# Patient Record
Sex: Female | Born: 1935 | Race: White | Hispanic: No | Marital: Married | Smoking: Never smoker
Health system: Southern US, Community
[De-identification: ages and names within clinical notes are randomized; demographics above are authoritative.]

## PROBLEM LIST (undated history)

## (undated) DIAGNOSIS — J342 Deviated nasal septum: Secondary | ICD-10-CM

## (undated) DIAGNOSIS — M1990 Unspecified osteoarthritis, unspecified site: Secondary | ICD-10-CM

## (undated) DIAGNOSIS — L439 Lichen planus, unspecified: Secondary | ICD-10-CM

## (undated) DIAGNOSIS — H401190 Primary open-angle glaucoma, unspecified eye, stage unspecified: Secondary | ICD-10-CM

## (undated) HISTORY — PX: DILATION AND CURETTAGE OF UTERUS: SHX78

## (undated) HISTORY — PX: TUMOR EXCISION: SHX421

## (undated) HISTORY — PX: NECK SURGERY: SHX720

---

## 2013-12-26 DIAGNOSIS — L719 Rosacea, unspecified: Secondary | ICD-10-CM | POA: Insufficient documentation

## 2013-12-26 DIAGNOSIS — N3281 Overactive bladder: Secondary | ICD-10-CM | POA: Insufficient documentation

## 2013-12-26 DIAGNOSIS — R7302 Impaired glucose tolerance (oral): Secondary | ICD-10-CM | POA: Insufficient documentation

## 2013-12-26 DIAGNOSIS — M8580 Other specified disorders of bone density and structure, unspecified site: Secondary | ICD-10-CM | POA: Insufficient documentation

## 2013-12-26 DIAGNOSIS — M810 Age-related osteoporosis without current pathological fracture: Secondary | ICD-10-CM | POA: Insufficient documentation

## 2013-12-26 DIAGNOSIS — M5136 Other intervertebral disc degeneration, lumbar region: Secondary | ICD-10-CM | POA: Insufficient documentation

## 2013-12-26 DIAGNOSIS — M48061 Spinal stenosis, lumbar region without neurogenic claudication: Secondary | ICD-10-CM | POA: Insufficient documentation

## 2013-12-26 DIAGNOSIS — R7303 Prediabetes: Secondary | ICD-10-CM | POA: Insufficient documentation

## 2013-12-26 DIAGNOSIS — E7800 Pure hypercholesterolemia, unspecified: Secondary | ICD-10-CM | POA: Insufficient documentation

## 2013-12-26 DIAGNOSIS — H409 Unspecified glaucoma: Secondary | ICD-10-CM | POA: Insufficient documentation

## 2013-12-26 DIAGNOSIS — G629 Polyneuropathy, unspecified: Secondary | ICD-10-CM | POA: Insufficient documentation

## 2014-02-04 DIAGNOSIS — M5416 Radiculopathy, lumbar region: Secondary | ICD-10-CM | POA: Insufficient documentation

## 2014-02-04 DIAGNOSIS — H903 Sensorineural hearing loss, bilateral: Secondary | ICD-10-CM | POA: Insufficient documentation

## 2014-02-04 DIAGNOSIS — M25552 Pain in left hip: Secondary | ICD-10-CM | POA: Insufficient documentation

## 2014-02-07 DIAGNOSIS — M461 Sacroiliitis, not elsewhere classified: Secondary | ICD-10-CM | POA: Insufficient documentation

## 2014-02-11 ENCOUNTER — Ambulatory Visit: Payer: Self-pay | Admitting: Physical Medicine and Rehabilitation

## 2014-03-27 DIAGNOSIS — M170 Bilateral primary osteoarthritis of knee: Secondary | ICD-10-CM | POA: Insufficient documentation

## 2014-04-29 DIAGNOSIS — M7062 Trochanteric bursitis, left hip: Secondary | ICD-10-CM | POA: Insufficient documentation

## 2014-07-27 ENCOUNTER — Other Ambulatory Visit: Payer: Self-pay | Admitting: Internal Medicine

## 2014-07-27 DIAGNOSIS — Z1231 Encounter for screening mammogram for malignant neoplasm of breast: Secondary | ICD-10-CM

## 2014-07-27 DIAGNOSIS — L219 Seborrheic dermatitis, unspecified: Secondary | ICD-10-CM | POA: Insufficient documentation

## 2014-08-20 ENCOUNTER — Ambulatory Visit
Admission: RE | Admit: 2014-08-20 | Discharge: 2014-08-20 | Disposition: A | Discharge status: Home / Self Care | Payer: Medicare PPO | Source: Ambulatory Visit | Attending: Internal Medicine | Admitting: Internal Medicine

## 2014-08-20 DIAGNOSIS — Z1231 Encounter for screening mammogram for malignant neoplasm of breast: Secondary | ICD-10-CM | POA: Diagnosis present

## 2014-10-21 DIAGNOSIS — M21371 Foot drop, right foot: Secondary | ICD-10-CM | POA: Insufficient documentation

## 2014-10-21 DIAGNOSIS — M21372 Foot drop, left foot: Secondary | ICD-10-CM | POA: Insufficient documentation

## 2015-01-27 DIAGNOSIS — N182 Chronic kidney disease, stage 2 (mild): Secondary | ICD-10-CM | POA: Insufficient documentation

## 2015-03-11 DIAGNOSIS — G5691 Unspecified mononeuropathy of right upper limb: Secondary | ICD-10-CM | POA: Insufficient documentation

## 2015-03-31 DIAGNOSIS — G5603 Carpal tunnel syndrome, bilateral upper limbs: Secondary | ICD-10-CM | POA: Insufficient documentation

## 2015-03-31 DIAGNOSIS — G5600 Carpal tunnel syndrome, unspecified upper limb: Secondary | ICD-10-CM | POA: Insufficient documentation

## 2015-04-22 DIAGNOSIS — R262 Difficulty in walking, not elsewhere classified: Secondary | ICD-10-CM | POA: Insufficient documentation

## 2015-05-26 ENCOUNTER — Other Ambulatory Visit: Payer: Self-pay | Admitting: Surgery

## 2015-05-26 DIAGNOSIS — M21372 Foot drop, left foot: Secondary | ICD-10-CM

## 2015-05-26 DIAGNOSIS — M21371 Foot drop, right foot: Secondary | ICD-10-CM

## 2015-05-26 DIAGNOSIS — M5136 Other intervertebral disc degeneration, lumbar region: Secondary | ICD-10-CM

## 2015-06-16 ENCOUNTER — Ambulatory Visit
Admission: RE | Admit: 2015-06-16 | Discharge: 2015-06-16 | Disposition: A | Discharge status: Home / Self Care | Payer: Medicare Other | Source: Ambulatory Visit | Attending: Surgery | Admitting: Surgery

## 2015-06-16 DIAGNOSIS — Q631 Lobulated, fused and horseshoe kidney: Secondary | ICD-10-CM | POA: Insufficient documentation

## 2015-06-16 DIAGNOSIS — K8020 Calculus of gallbladder without cholecystitis without obstruction: Secondary | ICD-10-CM | POA: Diagnosis not present

## 2015-06-16 DIAGNOSIS — M21371 Foot drop, right foot: Secondary | ICD-10-CM | POA: Diagnosis present

## 2015-06-16 DIAGNOSIS — M5136 Other intervertebral disc degeneration, lumbar region: Secondary | ICD-10-CM | POA: Insufficient documentation

## 2015-06-16 DIAGNOSIS — M21372 Foot drop, left foot: Secondary | ICD-10-CM | POA: Insufficient documentation

## 2015-06-16 DIAGNOSIS — M4856XA Collapsed vertebra, not elsewhere classified, lumbar region, initial encounter for fracture: Secondary | ICD-10-CM | POA: Insufficient documentation

## 2015-07-28 ENCOUNTER — Other Ambulatory Visit: Payer: Self-pay | Admitting: Internal Medicine

## 2015-07-28 DIAGNOSIS — K8689 Other specified diseases of pancreas: Secondary | ICD-10-CM

## 2015-07-28 DIAGNOSIS — Z1231 Encounter for screening mammogram for malignant neoplasm of breast: Secondary | ICD-10-CM

## 2015-07-28 DIAGNOSIS — K8020 Calculus of gallbladder without cholecystitis without obstruction: Secondary | ICD-10-CM | POA: Insufficient documentation

## 2015-08-13 ENCOUNTER — Ambulatory Visit
Admission: RE | Admit: 2015-08-13 | Discharge: 2015-08-13 | Disposition: A | Discharge status: Home / Self Care | Payer: Medicare Other | Source: Ambulatory Visit | Attending: Internal Medicine | Admitting: Internal Medicine

## 2015-08-13 DIAGNOSIS — K8020 Calculus of gallbladder without cholecystitis without obstruction: Secondary | ICD-10-CM | POA: Diagnosis not present

## 2015-08-13 DIAGNOSIS — K8689 Other specified diseases of pancreas: Secondary | ICD-10-CM | POA: Insufficient documentation

## 2015-08-13 DIAGNOSIS — Q631 Lobulated, fused and horseshoe kidney: Secondary | ICD-10-CM | POA: Insufficient documentation

## 2015-08-13 MED ORDER — GADOBENATE DIMEGLUMINE 529 MG/ML IV SOLN
15.0000 mL | Freq: Once | INTRAVENOUS | Status: AC | PRN
  Administered 2015-08-13: 15 mL via INTRAVENOUS

## 2015-08-23 ENCOUNTER — Ambulatory Visit
Admission: RE | Admit: 2015-08-23 | Discharge: 2015-08-23 | Disposition: A | Discharge status: Home / Self Care | Payer: Medicare Other | Source: Ambulatory Visit | Attending: Internal Medicine | Admitting: Internal Medicine

## 2015-08-23 DIAGNOSIS — Z1231 Encounter for screening mammogram for malignant neoplasm of breast: Secondary | ICD-10-CM | POA: Diagnosis not present

## 2015-09-07 ENCOUNTER — Other Ambulatory Visit: Payer: Self-pay

## 2015-09-08 ENCOUNTER — Ambulatory Visit (INDEPENDENT_AMBULATORY_CARE_PROVIDER_SITE_OTHER): Payer: Medicare Other | Admitting: Gastroenterology

## 2015-09-08 ENCOUNTER — Encounter: Payer: Self-pay | Admitting: Gastroenterology

## 2015-09-08 VITALS — BP 149/71 | HR 70 | Temp 98.6°F | Ht 67.0 in | Wt 164.0 lb

## 2015-09-08 DIAGNOSIS — K862 Cyst of pancreas: Secondary | ICD-10-CM

## 2015-09-08 NOTE — Progress Notes (Signed)
[facility] Consultation  Referring Provider:     Thies, David, MD Primary Care Physician:  Mickey Farber, MD Primary Gastroenterologist:  Dr. Servando Snare     Reason for Consultation:     Pancreatic cyst        HPI:   Alice Bond is a 82 y.o. y/o female referred for consultation & management of Pancreatic cyst by Dr. Harrington Challenger, DAVID, MD.  This patient was sent to me for evaluation of the pancreatic lesion. The patient had been having an MRI for her spine and was found to have an incidental finding of a pancreatic Lesion. The patient then underwent an MRI of the pancreas that showed a 3.1 on 1.1 cm lesion in the pancreatic tail consistent with a side branch IPMN. The patient has no symptoms of this lesion. She states that she has been in her normal state of health without any rectal bleeding diarrhea constipation or unexplained weight loss. The patient also reports that she has not had any abdominal pain.  Past Medical History  Diagnosis Date  . Diabetes mellitus without complication [facility])     Past Surgical History  Procedure Laterality Date  . Dilation and curettage of uterus    . Neck surgery      Prior to Admission medications   Medication Sig Start Date End Date Taking? Authorizing Provider  acetaminophen (TYLENOL) 500 MG tablet Take by mouth.   Yes Historical Provider, MD  Calcium Citrate-Vitamin D (CALCIUM CITRATE + D) 315-250 MG-UNIT TABS Take by mouth.   Yes Historical Provider, MD  ketoconazole (NIZORAL) 2 % cream Apply topically. 07/28/15  Yes Historical Provider, MD  latanoprost (XALATAN) 0.005 % ophthalmic solution Apply to eye. 07/17/14  Yes Historical Provider, MD  metFORMIN (GLUCOPHAGE-XR) 500 MG 24 hr tablet Take by mouth. 07/28/15  Yes Historical Provider, MD  metroNIDAZOLE (METROCREAM) 0.75 % cream Apply topically. 07/28/15  Yes Historical Provider, MD  Multiple Vitamin (MULTI-VITAMINS) TABS Take by mouth.   Yes Historical Provider, MD  tolterodine (DETROL) 2 MG tablet  Take by mouth. 08/11/15 08/10/16 Yes Historical Provider, MD  triamcinolone (KENALOG) 0.1 % paste Apply topically. 11/04/13  Yes Historical Provider, MD  trolamine salicylate (ASPERCREME) 10 % cream Apply topically.   Yes Historical Provider, MD    Family History  Problem Relation Age of Onset  . Breast cancer Mother 57  . Cancer Mother   . Breast cancer Maternal Aunt 71     Social History  Substance Use Topics  . Smoking status: Never Smoker   . Smokeless tobacco: Never Used  . Alcohol Use: Yes    Allergies as of 09/08/2015  . (No Known Allergies)    Review of Systems:    All systems reviewed and negative except where noted in HPI.   Physical Exam:  BP 149/71 mmHg  Pulse 70  Temp(Src) 98.6 F (37 C) (Oral)  Ht  (1.702 m)  Wt 164 lb (74.39 kg)  BMI 25.68 kg/m2 No LMP recorded. Patient is postmenopausal. Psych:  Alert and cooperative. Normal mood and affect. General:   Alert,  Well-developed, well-nourished, pleasant and cooperative in NAD Head:  Normocephalic and atraumatic. Eyes:  Sclera clear, no icterus.   Conjunctiva pink. Ears:  Normal auditory acuity. Nose:  No deformity, discharge, or lesions. Mouth:  No deformity or lesions,oropharynx pink & moist. Neck:  Supple; no masses or thyromegaly. Lungs:  Respirations even and unlabored.  Clear throughout to auscultation.   No wheezes, crackles, or rhonchi. No acute  distress. Heart:  Regular rate and rhythm; no murmurs, clicks, rubs, or gallops. Abdomen:  Normal bowel sounds.  No bruits.  Soft, non-tender and non-distended without masses, hepatosplenomegaly or hernias noted.  No guarding or rebound tenderness.  Negative Carnett sign.   Rectal:  Deferred.  Msk:  Symmetrical without gross deformities.  Good, equal movement & strength bilaterally. Pulses:  Normal pulses noted. Extremities:  No clubbing or edema.  No cyanosis. Neurologic:  Alert and oriented x3;  grossly normal neurologically. Skin:  Intact without  significant lesions or rashes.  No jaundice. Lymph Nodes:  No significant cervical adenopathy. Psych:  Alert and cooperative. Normal mood and affect.  Imaging Studies: Mr Abdomen W Wo Contrast  08/13/2015  CLINICAL DATA:  Abnormal finding on recent lumbar spine MRI. EXAM: MRI ABDOMEN WITHOUT AND WITH CONTRAST TECHNIQUE: Multiplanar multisequence MR imaging of the abdomen was performed both before and after the administration of intravenous contrast. CONTRAST:  15mL MULTIHANCE GADOBENATE DIMEGLUMINE 529 MG/ML IV SOLN COMPARISON:  06/16/2015 FINDINGS: Lower chest:  There is no pleural fluid identified. Hepatobiliary: Left lobe of liver cyst measures 8 mm. No suspicious enhancing liver abnormalities identified. There are multiple stones identified within the gallbladder. These measure up to 1.2 cm. There is no biliary dilatation. Pancreas: Multi locular cystic lesion within the distal tail of pancreas measures 3.2 x 1.1, image 16 of series 2. Spleen:  Normal appearance of the spleen. Adrenals/Urinary Tract: The adrenal glands are normal. 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 Bilateral pelvocaliectasis is identified. No discrete enhancing mass identified. Stomach/Bowel: Normal appearance of the stomach. The upper abdominal bowel loops have a normal caliber. Vascular/Lymphatic: Normal appearance of the abdominal aorta. No enlarged upper abdominal lymph nodes identified. Other:  There is no free fluid identified. Musculoskeletal: Scoliosis and degenerative disc disease is identified. IMPRESSION: 1. There is a cystic lesion within the tail of pancreas. This may represent a benign or potentially malignant cystic neoplasm. The appearance is favored to represent side branch differential considerations include serous cystadenoma or side branch IPMN. Management strategies would include a followup examination in 6 months versus endoscopic ultrasound and potential biopsy if indicated. 2. Gallstones.  No biliary dilatation. 3.  Horseshoe kidney with bilateral pelvocaliectasis left greater than right. Electronically Signed   By: Signa Kell M.D.   On: 08/13/2015 10:06   Mm Digital Screening Bilateral  08/23/2015  CLINICAL DATA:  Screening. EXAM: DIGITAL SCREENING BILATERAL MAMMOGRAM WITH CAD COMPARISON:  Previous exam(s). ACR Breast Density Category c: The breast tissue is heterogeneously dense, which may obscure small masses. FINDINGS: There are no findings suspicious for malignancy. Images were processed with CAD. IMPRESSION: No mammographic evidence of malignancy. A result letter of this screening mammogram will be mailed directly to the patient. RECOMMENDATION: Screening mammogram in one year. (Code:[id number]-01Y) BI-RADS CATEGORY  1: Negative. Electronically Signed   By: Rolla Plate M.D.   On: 08/23/2015 10:21    Assessment and Plan:   Alice Bond is a 82 y.o. y/o female comes in with an incidental finding of a cystic lesion in the pancreatic tail that is most consistent with a side branch IPMN. These are lesions that have neoplastic potential but the side branch lesions are less aggressive in the main branch lesions. The size of the lesion is 3.1 cm in its largest dimension which is concerning. The patient has been given an option of a endoscopic ultrasound versus repeating the MRI in 4 months to see if there is any increase in size. The patient  states that she has a minimalist view of this and does not want to be overly aggressive. Although the patient is in good health her advanced age of 82 ports are at increased risk for major pancreatic surgery. If the lesion is deemed to be undergoing malignant transformation or increasing in size the patient has agreed to further studies such as an EUS. The patient has been explained the plan and agrees with it.   Note: This dictation was prepared with Dragon dictation along with smaller phrase technology. Any transcriptional errors that result from this process are  unintentional.

## 2015-09-09 ENCOUNTER — Ambulatory Visit: Payer: Self-pay | Admitting: Gastroenterology

## 2015-11-02 ENCOUNTER — Other Ambulatory Visit: Payer: Self-pay

## 2015-11-02 ENCOUNTER — Telehealth: Payer: Self-pay | Admitting: Gastroenterology

## 2015-11-02 DIAGNOSIS — K869 Disease of pancreas, unspecified: Secondary | ICD-10-CM

## 2015-11-02 NOTE — Telephone Encounter (Signed)
Pt scheduled for a repeat MRI at [facility] on Sept 6th @ 9:00am. Pt has been given instructions and location.

## 2015-11-02 NOTE — Telephone Encounter (Signed)
Patient called and stated that she needs a repeat abdominal MRI the first of September. Please call

## 2015-12-15 ENCOUNTER — Ambulatory Visit
Admission: RE | Admit: 2015-12-15 | Discharge: 2015-12-15 | Disposition: A | Discharge status: Home / Self Care | Payer: Medicare Other | Source: Ambulatory Visit | Attending: Gastroenterology | Admitting: Gastroenterology

## 2015-12-15 ENCOUNTER — Other Ambulatory Visit: Payer: Self-pay

## 2015-12-15 DIAGNOSIS — K8020 Calculus of gallbladder without cholecystitis without obstruction: Secondary | ICD-10-CM | POA: Diagnosis not present

## 2015-12-15 DIAGNOSIS — K869 Disease of pancreas, unspecified: Secondary | ICD-10-CM

## 2015-12-15 DIAGNOSIS — K838 Other specified diseases of biliary tract: Secondary | ICD-10-CM | POA: Diagnosis present

## 2015-12-15 DIAGNOSIS — I700 Atherosclerosis of aorta: Secondary | ICD-10-CM | POA: Diagnosis not present

## 2015-12-15 DIAGNOSIS — K862 Cyst of pancreas: Secondary | ICD-10-CM | POA: Insufficient documentation

## 2015-12-15 DIAGNOSIS — N1330 Unspecified hydronephrosis: Secondary | ICD-10-CM | POA: Insufficient documentation

## 2015-12-15 DIAGNOSIS — Q631 Lobulated, fused and horseshoe kidney: Secondary | ICD-10-CM | POA: Diagnosis not present

## 2015-12-15 LAB — POCT I-STAT CREATININE: Creatinine, Ser: 0.8 mg/dL (ref 0.44–1.00)

## 2015-12-15 MED ORDER — GADOBENATE DIMEGLUMINE 529 MG/ML IV SOLN
15.0000 mL | Freq: Once | INTRAVENOUS | Status: AC | PRN
  Administered 2015-12-15: 14 mL via INTRAVENOUS

## 2015-12-17 ENCOUNTER — Other Ambulatory Visit: Payer: Self-pay

## 2015-12-20 ENCOUNTER — Ambulatory Visit (INDEPENDENT_AMBULATORY_CARE_PROVIDER_SITE_OTHER): Payer: Medicare Other | Admitting: Gastroenterology

## 2015-12-20 ENCOUNTER — Encounter: Payer: Self-pay | Admitting: Gastroenterology

## 2015-12-20 ENCOUNTER — Other Ambulatory Visit: Payer: Self-pay

## 2015-12-20 ENCOUNTER — Telehealth: Payer: Self-pay

## 2015-12-20 VITALS — BP 139/71 | HR 93 | Temp 98.3°F | Ht 67.0 in | Wt 168.0 lb

## 2015-12-20 DIAGNOSIS — K862 Cyst of pancreas: Secondary | ICD-10-CM | POA: Diagnosis not present

## 2015-12-20 NOTE — Progress Notes (Signed)
Primary Care Physician: Mickey FarberHIES, DAVID, MD  Primary Gastroenterologist:  Dr. Midge Miniumarren Laquida Cotrell  Chief Complaint  Patient presents with  . Follow up MRI results    HPI: Alice Bond is a 82 y.o. female here for follow-up after having an MRI of the pancreatic cyst that was found incidentally in the past. The patient has had no symptoms and reports feeling well. MRI of the pancreatic lesion showed the lesion to be stable and cystic. It was recommended that a repeat MRI be done with and without contrast in 2 years and that the features of the cysts were benign-appearing  Current Outpatient Prescriptions  Medication Sig Dispense Refill  . acetaminophen (TYLENOL) 500 MG tablet Take by mouth.    . Calcium Citrate-Vitamin D (CALCIUM CITRATE + D) 315-250 MG-UNIT TABS Take by mouth.    Marland Kitchen. ketoconazole (NIZORAL) 2 % cream Apply topically.    . latanoprost (XALATAN) 0.005 % ophthalmic solution Apply to eye.    . metFORMIN (GLUCOPHAGE-XR) 500 MG 24 hr tablet Take by mouth.    . metroNIDAZOLE (METROCREAM) 0.75 % cream Apply topically.    . Multiple Vitamin (MULTI-VITAMINS) TABS Take by mouth.    . tolterodine (DETROL) 1 MG tablet     . triamcinolone (KENALOG) 0.1 % paste Apply topically.    . trolamine salicylate (ASPERCREME) 10 % cream Apply topically.    . tolterodine (DETROL) 2 MG tablet Take by mouth.     No current facility-administered medications for this visit.     Allergies as of 12/20/2015  . (No Known Allergies)    ROS:  General: Negative for anorexia, weight loss, fever, chills, fatigue, weakness. ENT: Negative for hoarseness, difficulty swallowing , nasal congestion. CV: Negative for chest pain, angina, palpitations, dyspnea on exertion, peripheral edema.  Respiratory: Negative for dyspnea at rest, dyspnea on exertion, cough, sputum, wheezing.  GI: See history of present illness. GU:  Negative for dysuria, hematuria, urinary incontinence, urinary frequency, nocturnal urination.    Endo: Negative for unusual weight change.    Physical Examination:   BP 139/71   Pulse 93   Temp 98.3 F (36.8 C) (Oral)   Ht 5\' 7"  (1.702 m)   Wt 168 lb (76.2 kg)   BMI 26.31 kg/m   General: Well-nourished, well-developed in no acute distress.  Eyes: No icterus. Conjunctivae pink. Skin: Warm and dry, no jaundice.   Psych: Alert and cooperative, normal mood and affect.  Labs:    Imaging Studies: Mr Abdomen W Wo Contrast  Result Date: 12/15/2015 CLINICAL DATA:  Follow-up pancreatic tail cystic mass. EXAM: MRI ABDOMEN WITHOUT AND WITH CONTRAST TECHNIQUE: Multiplanar multisequence MR imaging of the abdomen was performed both before and after the administration of intravenous contrast. CONTRAST:  14mL MULTIHANCE GADOBENATE DIMEGLUMINE 529 MG/ML IV SOLN COMPARISON:  08/13/2015 MRI abdomen. FINDINGS: Lower chest: No significant pulmonary nodules or acute consolidative airspace disease. Hepatobiliary: Normal liver size. Stable 0.9 cm simple left liver lobe cyst. No additional liver lesions. No hepatic steatosis. Nondistended gallbladder is filled with several gallstones measuring up to 1.7 cm, with no gallbladder wall thickening or pericholecystic fluid . No biliary ductal dilatation. Common bile duct diameter 4 mm. No choledocholithiasis. Small periampullary duodenal diverticulum, unchanged. Pancreas: No evidence of pancreas divisum. There is a lobular 3.2 x 1.1 x 1.1 cm pancreatic tail cystic mass (series 7/image 21) with a few thin eccentric septations and no solid nodular enhancement, previously 3.2 x 1.1 x 1.0 cm, unchanged. No additional pancreatic lesions. Normal caliber  main pancreatic duct (2 mm diameter). Spleen: Normal size. No mass. Adrenals/Urinary Tract: Normal adrenals. Horseshoe kidney. No renal mass. No right hydronephrosis. Stable mild left hydronephrosis. Stomach/Bowel: Grossly normal stomach. Visualized small and large bowel is normal caliber, with no bowel wall thickening.  Vascular/Lymphatic: Atherosclerotic nonaneurysmal abdominal aorta. Patent portal, splenic, hepatic and renal veins. No pathologically enlarged lymph nodes in the abdomen. Other: No abdominal ascites or focal fluid collection. Musculoskeletal: No aggressive appearing focal osseous lesions. IMPRESSION: 1. Stable multilocular 3.2 cm pancreatic tail cystic mass without aggressive features. No main pancreatic duct dilation. Follow-up pancreas protocol MRI abdomen without and with IV contrast is recommended in 2 years. This recommendation follows ACR consensus guidelines: Management of Incidental Pancreatic Cysts: A White Paper of the ACR Incidental Findings Committee. J Am Coll Radiol 2017;14:911-923. 2. Cholelithiasis. No biliary ductal dilatation. No choledocholithiasis. 3. Horseshoe kidney with stable mild left hydronephrosis. 4. Aortic atherosclerosis. Electronically Signed   By: Delbert Phenix M.D.   On: 12/15/2015 11:15    Assessment and Plan:   Alice Bond is a 82 y.o. y/o female for follow-up after having a MRI of a pancreas cyst. The MRI showed the cyst to be benign appearing and a repeat MRI was recommended in 2 years as per [employer] of radiology protocols. The patient has been explained this and agrees with the plan.   Note: This dictation was prepared with Dragon dictation along with smaller phrase technology. Any transcriptional errors that result from this process are unintentional.

## 2015-12-20 NOTE — Progress Notes (Signed)
Let the patient know the MRi is stable and did not change.

## 2015-12-20 NOTE — Telephone Encounter (Signed)
Pt had an appt today (12/20/15) to discuss results of MRI.

## 2015-12-20 NOTE — Telephone Encounter (Signed)
-----   Message from Midge Miniumarren Wohl, MD sent at 12/20/2015 11:18 AM EDT ----- Let the patient know the MRi is stable and did not change.

## 2016-02-24 ENCOUNTER — Encounter
Admission: EM | Disposition: A | Discharge status: Home Health | Payer: Self-pay | Source: Home / Self Care | Attending: Surgery

## 2016-02-24 ENCOUNTER — Emergency Department: Payer: Medicare Other | Admitting: Certified Registered Nurse Anesthetist

## 2016-02-24 ENCOUNTER — Emergency Department: Payer: Medicare Other

## 2016-02-24 ENCOUNTER — Inpatient Hospital Stay
Admission: EM | Admit: 2016-02-24 | Discharge: 2016-03-05 | DRG: 328 | Disposition: A | Discharge status: Home Health | Payer: Medicare Other | Attending: Surgery | Admitting: Surgery

## 2016-02-24 ENCOUNTER — Encounter: Payer: Self-pay | Admitting: Emergency Medicine

## 2016-02-24 DIAGNOSIS — R112 Nausea with vomiting, unspecified: Secondary | ICD-10-CM

## 2016-02-24 DIAGNOSIS — K275 Chronic or unspecified peptic ulcer, site unspecified, with perforation: Secondary | ICD-10-CM | POA: Diagnosis not present

## 2016-02-24 DIAGNOSIS — R1110 Vomiting, unspecified: Secondary | ICD-10-CM

## 2016-02-24 DIAGNOSIS — R109 Unspecified abdominal pain: Secondary | ICD-10-CM | POA: Diagnosis present

## 2016-02-24 DIAGNOSIS — Z801 Family history of malignant neoplasm of trachea, bronchus and lung: Secondary | ICD-10-CM

## 2016-02-24 DIAGNOSIS — K631 Perforation of intestine (nontraumatic): Secondary | ICD-10-CM | POA: Diagnosis not present

## 2016-02-24 DIAGNOSIS — K261 Acute duodenal ulcer with perforation: Principal | ICD-10-CM | POA: Diagnosis present

## 2016-02-24 DIAGNOSIS — N182 Chronic kidney disease, stage 2 (mild): Secondary | ICD-10-CM | POA: Diagnosis present

## 2016-02-24 DIAGNOSIS — E7800 Pure hypercholesterolemia, unspecified: Secondary | ICD-10-CM | POA: Diagnosis present

## 2016-02-24 DIAGNOSIS — R1084 Generalized abdominal pain: Secondary | ICD-10-CM

## 2016-02-24 DIAGNOSIS — Z803 Family history of malignant neoplasm of breast: Secondary | ICD-10-CM

## 2016-02-24 DIAGNOSIS — M6281 Muscle weakness (generalized): Secondary | ICD-10-CM

## 2016-02-24 DIAGNOSIS — R262 Difficulty in walking, not elsewhere classified: Secondary | ICD-10-CM

## 2016-02-24 HISTORY — PX: LAPAROTOMY: SHX154

## 2016-02-24 LAB — LIPASE, BLOOD: LIPASE: 32 U/L (ref 11–51)

## 2016-02-24 LAB — DIFFERENTIAL
BASOS PCT: 0 %
Basophils Absolute: 0 10*3/uL (ref 0–0.1)
EOS ABS: 0 10*3/uL (ref 0–0.7)
EOS PCT: 0 %
LYMPHS ABS: 1.4 10*3/uL (ref 1.0–3.6)
Lymphocytes Relative: 12 %
MONOS PCT: 8 %
Monocytes Absolute: 1 10*3/uL — ABNORMAL HIGH (ref 0.2–0.9)
Neutro Abs: 9.3 10*3/uL — ABNORMAL HIGH (ref 1.4–6.5)
Neutrophils Relative %: 80 %

## 2016-02-24 LAB — COMPREHENSIVE METABOLIC PANEL
ALT: 31 U/L (ref 14–54)
AST: 45 U/L — ABNORMAL HIGH (ref 15–41)
Albumin: 3.2 g/dL — ABNORMAL LOW (ref 3.5–5.0)
Alkaline Phosphatase: 52 U/L (ref 38–126)
Anion gap: 9 (ref 5–15)
BILIRUBIN TOTAL: 0.8 mg/dL (ref 0.3–1.2)
BUN: 21 mg/dL — ABNORMAL HIGH (ref 6–20)
CHLORIDE: 103 mmol/L (ref 101–111)
CO2: 25 mmol/L (ref 22–32)
CREATININE: 0.79 mg/dL (ref 0.44–1.00)
Calcium: 8.5 mg/dL — ABNORMAL LOW (ref 8.9–10.3)
Glucose, Bld: 128 mg/dL — ABNORMAL HIGH (ref 65–99)
POTASSIUM: 3.3 mmol/L — AB (ref 3.5–5.1)
Sodium: 137 mmol/L (ref 135–145)
TOTAL PROTEIN: 6.2 g/dL — AB (ref 6.5–8.1)

## 2016-02-24 LAB — CBC
HEMATOCRIT: 39.6 % (ref 35.0–47.0)
HEMOGLOBIN: 12.7 g/dL (ref 12.0–16.0)
MCH: 28.8 pg (ref 26.0–34.0)
MCHC: 32.1 g/dL (ref 32.0–36.0)
MCV: 89.8 fL (ref 80.0–100.0)
Platelets: 143 10*3/uL — ABNORMAL LOW (ref 150–440)
RBC: 4.41 MIL/uL (ref 3.80–5.20)
RDW: 14.5 % (ref 11.5–14.5)
WBC: 11.7 10*3/uL — ABNORMAL HIGH (ref 3.6–11.0)

## 2016-02-24 SURGERY — LAPAROTOMY, EXPLORATORY
Anesthesia: General | Wound class: Contaminated

## 2016-02-24 MED ORDER — HYDROMORPHONE HCL 1 MG/ML IJ SOLN: 1.0000 mg | Freq: Once | INTRAMUSCULAR | Status: DC

## 2016-02-24 MED ORDER — ACETAMINOPHEN 10 MG/ML IV SOLN
INTRAVENOUS | Status: DC | PRN
  Administered 2016-02-24: 1000 mg via INTRAVENOUS

## 2016-02-24 MED ORDER — IOPAMIDOL (ISOVUE-300) INJECTION 61%
100.0000 mL | Freq: Once | INTRAVENOUS | Status: AC | PRN
  Administered 2016-02-24: 100 mL via INTRAVENOUS

## 2016-02-24 MED ORDER — BUPIVACAINE LIPOSOME 1.3 % IJ SUSP
INTRAMUSCULAR | Status: DC | PRN
  Administered 2016-02-24: 20 mL

## 2016-02-24 MED ORDER — ONDANSETRON HCL 4 MG/2ML IJ SOLN
4.0000 mg | Freq: Once | INTRAMUSCULAR | Status: AC
Start: 2016-02-24 — End: 2016-02-24
  Administered 2016-02-24: 4 mg via INTRAVENOUS
  Filled 2016-02-24: qty 2

## 2016-02-24 MED ORDER — FENTANYL CITRATE (PF) 100 MCG/2ML IJ SOLN
INTRAMUSCULAR | Status: DC | PRN
  Administered 2016-02-24: 50 ug via INTRAVENOUS
  Administered 2016-02-24: 100 ug via INTRAVENOUS
  Administered 2016-02-24: 50 ug via INTRAVENOUS

## 2016-02-24 MED ORDER — ETOMIDATE 2 MG/ML IV SOLN
INTRAVENOUS | Status: DC | PRN
  Administered 2016-02-24: 20 mg via INTRAVENOUS

## 2016-02-24 MED ORDER — MORPHINE SULFATE 2 MG/ML IV SOLN
INTRAVENOUS | Status: DC
  Administered 2016-02-25: 2 mg via INTRAVENOUS
  Administered 2016-02-25 (×2): 1 mg via INTRAVENOUS
  Administered 2016-02-25: 02:00:00 via INTRAVENOUS
  Administered 2016-02-25: 2 mL via INTRAVENOUS
  Administered 2016-02-25: 0 mg via INTRAVENOUS
  Administered 2016-02-26: 2 mg via INTRAVENOUS
  Administered 2016-02-26: 4 mg via INTRAVENOUS
  Administered 2016-02-26 (×2): 2 mg via INTRAVENOUS
  Administered 2016-02-26: 4 mg via INTRAVENOUS
  Administered 2016-02-27 (×2): 1 mg via INTRAVENOUS
  Administered 2016-02-27: 2 mg via INTRAVENOUS
  Administered 2016-02-27: 1 mg via INTRAVENOUS
  Administered 2016-02-27 (×2): 3 mg via INTRAVENOUS
  Administered 2016-02-27: 4 mg via INTRAVENOUS
  Administered 2016-02-28: 1 mg via INTRAVENOUS
  Administered 2016-02-28: 2 mg via INTRAVENOUS
  Administered 2016-02-29: 6 mg via INTRAVENOUS
  Administered 2016-02-29: 7 mg via INTRAVENOUS
  Administered 2016-02-29: 5 mg via INTRAVENOUS
  Filled 2016-02-24 (×2): qty 25

## 2016-02-24 MED ORDER — DEXAMETHASONE SODIUM PHOSPHATE 10 MG/ML IJ SOLN
INTRAMUSCULAR | Status: DC | PRN
  Administered 2016-02-24: 8 mg via INTRAVENOUS

## 2016-02-24 MED ORDER — PIPERACILLIN-TAZOBACTAM 3.375 G IVPB
INTRAVENOUS | Status: AC
  Filled 2016-02-24: qty 50

## 2016-02-24 MED ORDER — SODIUM CHLORIDE 0.9 % IJ SOLN
INTRAMUSCULAR | Status: DC | PRN
  Administered 2016-02-24: 50 mL via INTRAVENOUS

## 2016-02-24 MED ORDER — SUCCINYLCHOLINE CHLORIDE 20 MG/ML IJ SOLN
INTRAMUSCULAR | Status: DC | PRN
  Administered 2016-02-24: 100 mg via INTRAVENOUS

## 2016-02-24 MED ORDER — DIPHENHYDRAMINE HCL 12.5 MG/5ML PO ELIX: 12.5000 mg | ORAL_SOLUTION | Freq: Four times a day (QID) | ORAL | Status: DC | PRN

## 2016-02-24 MED ORDER — NALOXONE HCL 0.4 MG/ML IJ SOLN: 0.4000 mg | INTRAMUSCULAR | Status: DC | PRN

## 2016-02-24 MED ORDER — BUPIVACAINE-EPINEPHRINE (PF) 0.25% -1:200000 IJ SOLN
INTRAMUSCULAR | Status: DC | PRN
  Administered 2016-02-24: 30 mL via PERINEURAL

## 2016-02-24 MED ORDER — SUGAMMADEX SODIUM 200 MG/2ML IV SOLN
INTRAVENOUS | Status: DC | PRN
  Administered 2016-02-24: 149.6 mg via INTRAVENOUS

## 2016-02-24 MED ORDER — EPHEDRINE SULFATE 50 MG/ML IJ SOLN
INTRAMUSCULAR | Status: DC | PRN
  Administered 2016-02-24: 5 mg via INTRAVENOUS

## 2016-02-24 MED ORDER — HYDROMORPHONE HCL 1 MG/ML IJ SOLN: 0.2500 mg | INTRAMUSCULAR | Status: DC | PRN

## 2016-02-24 MED ORDER — CEFAZOLIN SODIUM 1 G IJ SOLR
INTRAMUSCULAR | Status: DC | PRN
  Administered 2016-02-24: 2 g via INTRAMUSCULAR

## 2016-02-24 MED ORDER — IOPAMIDOL (ISOVUE-300) INJECTION 61%
30.0000 mL | Freq: Once | INTRAVENOUS | Status: AC | PRN
  Administered 2016-02-24: 30 mL via ORAL

## 2016-02-24 MED ORDER — FAMOTIDINE IN NACL 20-0.9 MG/50ML-% IV SOLN
20.0000 mg | Freq: Once | INTRAVENOUS | Status: AC
  Administered 2016-02-24: 20 mg via INTRAVENOUS
  Filled 2016-02-24: qty 50

## 2016-02-24 MED ORDER — HYDROMORPHONE HCL 1 MG/ML IJ SOLN
0.5000 mg | Freq: Once | INTRAMUSCULAR | Status: AC
  Administered 2016-02-24: 0.5 mg via INTRAVENOUS
  Filled 2016-02-24: qty 1

## 2016-02-24 MED ORDER — FENTANYL CITRATE (PF) 100 MCG/2ML IJ SOLN
25.0000 ug | INTRAMUSCULAR | Status: DC | PRN
  Administered 2016-02-24 (×4): 25 ug via INTRAVENOUS

## 2016-02-24 MED ORDER — LACTATED RINGERS IV SOLN
INTRAVENOUS | Status: DC | PRN
  Administered 2016-02-24 (×2): via INTRAVENOUS

## 2016-02-24 MED ORDER — LACTATED RINGERS IV SOLN
INTRAVENOUS | Status: DC | PRN
  Administered 2016-02-24: 21:00:00 via INTRAVENOUS

## 2016-02-24 MED ORDER — ONDANSETRON HCL 4 MG/2ML IJ SOLN
4.0000 mg | Freq: Four times a day (QID) | INTRAMUSCULAR | Status: DC | PRN
  Administered 2016-02-29: 4 mg via INTRAVENOUS
  Filled 2016-02-24: qty 2

## 2016-02-24 MED ORDER — FENTANYL CITRATE (PF) 100 MCG/2ML IJ SOLN
INTRAMUSCULAR | Status: AC
  Filled 2016-02-24: qty 2

## 2016-02-24 MED ORDER — DEXTROSE IN LACTATED RINGERS 5 % IV SOLN
INTRAVENOUS | Status: DC
  Administered 2016-02-25 – 2016-02-26 (×4): via INTRAVENOUS

## 2016-02-24 MED ORDER — SODIUM CHLORIDE 0.9 % IV SOLN
Freq: Once | INTRAVENOUS | Status: AC
  Administered 2016-02-24: 15:00:00 via INTRAVENOUS

## 2016-02-24 MED ORDER — ENOXAPARIN SODIUM 40 MG/0.4ML ~~LOC~~ SOLN: 40.0000 mg | SUBCUTANEOUS | Status: DC

## 2016-02-24 MED ORDER — ROCURONIUM BROMIDE 100 MG/10ML IV SOLN
INTRAVENOUS | Status: DC | PRN
  Administered 2016-02-24 (×4): 10 mg via INTRAVENOUS
  Administered 2016-02-24: 30 mg via INTRAVENOUS

## 2016-02-24 MED ORDER — PANTOPRAZOLE SODIUM 40 MG IV SOLR
40.0000 mg | Freq: Two times a day (BID) | INTRAVENOUS | Status: DC
  Administered 2016-02-25 – 2016-03-04 (×19): 40 mg via INTRAVENOUS
  Filled 2016-02-24 (×19): qty 40

## 2016-02-24 MED ORDER — PANTOPRAZOLE SODIUM 40 MG IV SOLR
40.0000 mg | Freq: Once | INTRAVENOUS | Status: AC
Start: 2016-02-24 — End: 2016-02-24
  Administered 2016-02-24: 40 mg via INTRAVENOUS
  Filled 2016-02-24: qty 40

## 2016-02-24 MED ORDER — SODIUM CHLORIDE 0.9% FLUSH: 9.0000 mL | INTRAVENOUS | Status: DC | PRN

## 2016-02-24 MED ORDER — ONDANSETRON HCL 4 MG/2ML IJ SOLN
INTRAMUSCULAR | Status: DC | PRN
  Administered 2016-02-24: 4 mg via INTRAVENOUS

## 2016-02-24 MED ORDER — DIPHENHYDRAMINE HCL 50 MG/ML IJ SOLN: 12.5000 mg | Freq: Four times a day (QID) | INTRAMUSCULAR | Status: DC | PRN

## 2016-02-24 MED ORDER — ONDANSETRON HCL 4 MG/2ML IJ SOLN
4.0000 mg | Freq: Once | INTRAMUSCULAR | Status: AC
  Administered 2016-02-24: 4 mg via INTRAVENOUS
  Filled 2016-02-24: qty 2

## 2016-02-24 SURGICAL SUPPLY — 55 items
APPLIER CLIP 11 MED OPEN (CLIP)
APPLIER CLIP 13 LRG OPEN (CLIP)
BLADE CLIPPER SURG (BLADE) IMPLANT
BLADE SURG 15 STRL LF DISP TIS (BLADE) IMPLANT
BLADE SURG 15 STRL SS (BLADE)
BULB RESERV EVAC DRAIN JP 100C (MISCELLANEOUS) ×4 IMPLANT
CANISTER SUCT 3000ML (MISCELLANEOUS) ×2 IMPLANT
CATH ROBINSON RED A/P 16FR (CATHETERS) ×2 IMPLANT
CHLORAPREP W/TINT 26ML (MISCELLANEOUS) ×2 IMPLANT
CLIP APPLIE 11 MED OPEN (CLIP) IMPLANT
CLIP APPLIE 13 LRG OPEN (CLIP) IMPLANT
DRAIN CHANNEL JP 19F (MISCELLANEOUS) ×4 IMPLANT
DRAPE LAPAROTOMY 100X77 ABD (DRAPES) ×2 IMPLANT
DRAPE TABLE BACK 80X90 (DRAPES) IMPLANT
DRSG TEGADERM 2-3/8X2-3/4 SM (GAUZE/BANDAGES/DRESSINGS) IMPLANT
DRSG TELFA 3X8 NADH (GAUZE/BANDAGES/DRESSINGS) ×2 IMPLANT
ELECT BLADE 6.5 EXT (BLADE) ×2 IMPLANT
ELECT REM PT RETURN 9FT ADLT (ELECTROSURGICAL) ×2
ELECTRODE REM PT RTRN 9FT ADLT (ELECTROSURGICAL) ×1 IMPLANT
GAUZE SPONGE 4X4 12PLY STRL (GAUZE/BANDAGES/DRESSINGS) ×2 IMPLANT
GLOVE BIO SURGEON STRL SZ7 (GLOVE) ×10 IMPLANT
GOWN STRL REUS W/ TWL LRG LVL3 (GOWN DISPOSABLE) ×3 IMPLANT
GOWN STRL REUS W/TWL LRG LVL3 (GOWN DISPOSABLE) ×3
HANDLE SUCTION POOLE (INSTRUMENTS) IMPLANT
HANDLE YANKAUER SUCT BULB TIP (MISCELLANEOUS) IMPLANT
LIGASURE IMPACT 36 18CM CVD LR (INSTRUMENTS) IMPLANT
NEEDLE HYPO 22GX1.5 SAFETY (NEEDLE) ×2 IMPLANT
NEEDLE HYPO 25X1 1.5 SAFETY (NEEDLE) IMPLANT
PACK BASIN MAJOR ARMC (MISCELLANEOUS) ×2 IMPLANT
RELOAD PROXIMATE 75MM BLUE (ENDOMECHANICALS) IMPLANT
SLEEVE SCD COMPRESS THIGH MED (MISCELLANEOUS) ×2 IMPLANT
SPONGE DRAIN TRACH 4X4 STRL 2S (GAUZE/BANDAGES/DRESSINGS) ×6 IMPLANT
SPONGE LAP 18X18 5 PK (GAUZE/BANDAGES/DRESSINGS) ×2 IMPLANT
SPONGE LAP 18X36 2PK (MISCELLANEOUS) IMPLANT
STAPLER PROXIMATE 75MM BLUE (STAPLE) IMPLANT
STAPLER SKIN PROX 35W (STAPLE) ×2 IMPLANT
SUCTION POOLE HANDLE (INSTRUMENTS)
SUT ETHILON 3-0 FS-10 30 BLK (SUTURE) ×4
SUT PDS AB 0 CT1 27 (SUTURE) IMPLANT
SUT PDS AB 1 TP1 96 (SUTURE) ×2 IMPLANT
SUT SILK 2 0 (SUTURE) ×2
SUT SILK 2 0 SH CR/8 (SUTURE) ×2 IMPLANT
SUT SILK 2 0SH CR/8 30 (SUTURE) ×2 IMPLANT
SUT SILK 2-0 18XBRD TIE 12 (SUTURE) ×2 IMPLANT
SUT VIC AB 0 CT1 36 (SUTURE) ×2 IMPLANT
SUT VIC AB 2-0 SH 27 (SUTURE) ×3
SUT VIC AB 2-0 SH 27XBRD (SUTURE) ×3 IMPLANT
SUTURE EHLN 3-0 FS-10 30 BLK (SUTURE) ×2 IMPLANT
SYR 20CC LL (SYRINGE) ×2 IMPLANT
SYR 30ML LL (SYRINGE) IMPLANT
SYR 3ML LL SCALE MARK (SYRINGE) ×2 IMPLANT
SYR TOOMEY 50ML (SYRINGE) ×2 IMPLANT
SYRINGE IRR TOOMEY STRL 70CC (SYRINGE) ×2 IMPLANT
TAPE MICROFOAM 4IN (TAPE) IMPLANT
TRAY FOLEY W/METER SILVER 16FR (SET/KITS/TRAYS/PACK) ×2 IMPLANT

## 2016-02-24 NOTE — ED Provider Notes (Signed)
[facility] Emergency Department Provider Note        Time seen: ----------------------------------------- 2:45 PM on 02/24/2016 -----------------------------------------    I have reviewed the triage vital signs and the nursing notes.   HISTORY  Chief Complaint Abdominal Pain    HPI Alice Bond is a 82 y.o. female who presents from the ER being sent by her primary care doctor for abdominal pain. Patient had outpatient labs which revealed white blood cell count elevation. She reports having abdominal pain that started last night with subsequent vomiting throughout the night. She vomited multiple times last vomited around 5 AM. Patient states she had a transient episode of having trouble finding her words this morning but denies any complaints other than mild abdominal discomfort currently. She denies diarrhea, fever or other complaints.   Past Medical History:  Diagnosis Date  . Diabetes mellitus without complication [facility](HCC)     Patient Active Problem List   Diagnosis Date Noted  . Calculus of gallbladder 07/28/2015  . Disorder of pancreatic duct 07/28/2015  . Difficulty in walking 04/22/2015  . Carpal tunnel syndrome 03/31/2015  . Mononeuropathy of right upper extremity 03/11/2015  . Chronic kidney disease (CKD), stage II (mild) 01/27/2015  . Dropfoot 10/21/2014  . Seborrhea capitis 07/27/2014  . Trochanteric bursitis of left hip 04/29/2014  . Primary osteoarthritis of both knees 03/27/2014  . Inflammation of sacroiliac joint (HCC) 02/07/2014  . Arthralgia of hip 02/04/2014  . Neuritis or radiculitis due to rupture of lumbar intervertebral disc 02/04/2014  . Bilateral sensorineural hearing loss 02/04/2014  . Acne erythematosa 12/26/2013  . Degeneration of intervertebral disc of lumbar region 12/26/2013  . Glaucoma 12/26/2013  . Hypercholesterolemia 12/26/2013  . 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 Lumbar canal stenosis 12/26/2013  .  Osteopenia 12/26/2013  . 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 Polyneuropathy (HCC) 12/26/2013    Past Surgical History:  Procedure Laterality Date  . DILATION AND CURETTAGE OF UTERUS    . NECK SURGERY      Allergies Patient has no known allergies.  Social History Social History  Substance Use Topics  . Smoking status: Never Smoker  . Smokeless tobacco: Never Used  . Alcohol use Yes     Comment: seldom    Review of Systems Constitutional: Negative for fever. Cardiovascular: Negative for chest pain. Respiratory: Negative for shortness of breath. Gastrointestinal: Positive for abdominal pain, vomiting Genitourinary: Negative for dysuria. Musculoskeletal: Negative for back pain. Skin: Negative for rash. Neurological: Negative for headaches, focal weakness or numbness.  10-point ROS otherwise negative.  ____________________________________________   PHYSICAL EXAM:  VITAL SIGNS: ED Triage Vitals  Enc Vitals Group     BP 02/24/16 1251 (!) 135/111     Pulse Rate 02/24/16 1251 83     Resp 02/24/16 1251 18     Temp 02/24/16 1251 98.1 F (36.7 C)     Temp Source 02/24/16 1251 Oral     SpO2 02/24/16 1251 94 %     Weight 02/24/16 1252 165 lb (74.8 kg)     Height 02/24/16 1252 5\' 7"  (1.702 m)     Head Circumference --      Peak Flow --      Pain Score 02/24/16 1252 7     Pain Loc --      Pain Edu? --      Excl. in GC? --     Constitutional: Alert and oriented. Well appearing and in no distress. Eyes: Conjunctivae are normal. PERRL. Normal  extraocular movements. ENT   Head: Normocephalic and atraumatic.   Nose: No congestion/rhinnorhea.   Mouth/Throat: Mucous membranes are moist.   Neck: No stridor. Cardiovascular: Normal rate, regular rhythm. No murmurs, rubs, or gallops. Respiratory: Normal respiratory effort without tachypnea nor retractions. Breath sounds are clear and equal bilaterally. No wheezes/rales/rhonchi. Gastrointestinal: Distended,  nontender, normal bowel sounds. Musculoskeletal: Nontender with normal range of motion in all extremities. No lower extremity tenderness nor edema. Neurologic:  Normal speech and language. No gross focal neurologic deficits are appreciated.  Skin:  Skin is warm, dry and intact. No rash noted. Psychiatric: Mood and affect are normal. Speech and behavior are normal.  ____________________________________________  ED COURSE:  Pertinent labs & imaging results that were available during my care of the patient were reviewed by me and considered in my medical decision making (see chart for details). Clinical Course   Patient is in no distress, we will assess with labs and likely CT imaging. Outpatient labs reveal leukocytosis.  Procedures ____________________________________________   LABS (pertinent positives/negatives)  Labs Reviewed  COMPREHENSIVE METABOLIC PANEL    RADIOLOGY  CT of the abdomen and pelvis with contrast is pending  ____________________________________________  FINAL ASSESSMENT AND PLAN  Abdominal pain, vomiting  Plan: Patient with labs and imaging as dictated above. Final disposition is pending at this time. Patient care is checked out to Dr. Huel CoteQuigley for final disposition.   Emily FilbertWilliams, Jonathan E, MD   Note: This dictation was prepared with Dragon dictation. Any transcriptional errors that result from this process are unintentional    Emily FilbertJonathan E Williams, MD 02/24/16 1447

## 2016-02-24 NOTE — Transfer of Care (Signed)
Immediate Anesthesia Transfer of Care Note  Patient: Alice Bond  Procedure(s) Performed: Procedure(s): EXPLORATORY LAPAROTOMY with repair of duodenal perforation (N/A)  Patient Location: PACU  Anesthesia Type:General  Level of Consciousness: sedated  Airway & Oxygen Therapy: Patient Spontanous Breathing and Patient connected to face mask oxygen  Post-op Assessment: Report given to RN and Post -op Vital signs reviewed and stable  Post vital signs: Reviewed and stable  Last Vitals:  Vitals:   02/24/16 1915 02/24/16 1920  BP: 126/62   Pulse: 72 73  Resp:    Temp:      Last Pain:  Vitals:   02/24/16 1903  TempSrc:   PainSc: 0-No pain         Complications: No apparent anesthesia complications

## 2016-02-24 NOTE — ED Notes (Signed)
Pt up to bathroom then taken by orderly to surgery. Clothes with pt. Watch and glasses placed in pt's shoe for safety.

## 2016-02-24 NOTE — ED Provider Notes (Signed)
----------------------------------------- 7:20 PM on 02/24/2016 -----------------------------------------   Blood pressure (!) 106/54, pulse 85, temperature 98.4 F (36.9 C), temperature source Oral, resp. rate 20, height 5\' 7"  (1.702 m), weight 165 lb (74.8 kg), SpO2 95 %.  Assuming care from Dr. Mayford KnifeWilliams.  In short, Alice Bond is a 82 y.o. female with a chief complaint of Abdominal Pain .  Refer to the original H&P for additional details.  The current plan of care is to  Perform a CT and there was a delay in the patient's laboratory work secondary to labeling. Patient had repeat laboratory work and once her creatinine level was verified to be within normal limits underwent abdominal pelvic CT for evaluation. Patient received Dilaudid 0.5 mg IV for pain along with Zofran 4 mg IV  "Ct Abdomen Pelvis W Contrast  Result Date: 02/24/2016 CLINICAL DATA:  Abdominal pain. Elevated white blood cell count. Vomiting. EXAM: CT ABDOMEN AND PELVIS WITH CONTRAST TECHNIQUE: Multidetector CT imaging of the abdomen and pelvis was performed using the standard protocol following bolus administration of intravenous contrast. CONTRAST:  100mL ISOVUE-300 IOPAMIDOL (ISOVUE-300) IV COMPARISON:  Abdominal MRI 12/15/2015 FINDINGS: Lower chest: Streaky dependent lower lobe opacities, right greater than left, likely hypoventilatory atelectasis. No pleural fluid. Coronary artery calcifications are seen. Hepatobiliary: Calcified gallstones within physiologically distended gallbladder. Soft tissue stranding in the right upper quadrant felt to be secondary to duodenal process, less likely gallbladder inflammation, soft tissue stranding does not appear centered in the gallbladder. Subcapsular 8 mm lesion in the left lobe of the liver is likely a simple cyst. No biliary dilatation. Pancreas: Cystic pancreatic tail lesion measures 3.5 x 1.1 cm with probable internal septations. This is likely unchanged in size from prior MRI  allowing for differences in caliper placement and modality. No main pancreatic ductal dilatation. No peripancreatic inflammation. Spleen: Normal in size without focal abnormality. Adrenals/Urinary Tract: Horseshoe kidney with hydronephrosis of the left renal moiety, chronic. The degree of hydronephrosis may have is not significantly changed. No hydronephrosis of the right renal moiety. No evidence urolithiasis. Urinary bladder is physiologically distended. There is no adrenal nodule. Stomach/Bowel: Stomach is physiologically distended with enteric contrast. There is duodenal wall thickening involving the second and third portion of the duodenum with irregular extraluminal air in fluid adjacent to the duodenum lobe. Adjacent soft tissue stranding is seen. Remaining small bowel is normal in caliber. There is enteric contrast within the ascending and transverse colon. Moderate stool burden. The appendix is normal. Vascular/Lymphatic: Tortuous intra-abdominal vessels with mild atherosclerosis. No aneurysm. Small retroperitoneal lymph nodes, not enlarged by size criteria. No pelvic adenopathy. Reproductive: Atrophic uterus with calcifications, may be combination of fibroids and vascular. No adnexal mass. Other: No ascites. Musculoskeletal: Scoliosis and degenerative change in the spine. Chronic appearing compression deformity of L1. IMPRESSION: 1. Periduodenal inflammation with extraluminal fluid and air concerning for duodenum perforation, possible duodenal ulcer. 2. Horseshoe kidney with chronic left hydronephrosis. 3. 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 4. Cholelithiasis. Soft tissue stranding in the right upper quadrant felt to be secondary to duodenal inflammation, and is not centered on the gallbladder. Critical Value/emergent results were called by telephone at the time of interpretation on 02/24/2016 at 6:56 pm to Dr. Fanny BienQuale , who  verbally acknowledged these results. Electronically Signed   By: Rubye OaksMelanie  Ehinger M.D.   On: 02/24/2016 18:59  " Patient had findings indicative of a perforated duodenum. Patient was started on IV  antacid medication with Pepcid IV, Protonix IV. 6 and CBC were added to her laboratory work is been no white blood cell count was elevated prior to arrival. Patient's otherwise hemodynamically stable and appears to be tolerating her bowel perforation well. I spoke to general surgery is agreed to see and evaluate the patient.  Assessment: Bowel perforation  Plan: Surgical consultation was likely admission      Jennye MoccasinBrian S Quigley, MD 02/24/16 [phone]

## 2016-02-24 NOTE — ED Notes (Signed)
Per lab blood work not received. They are looking for blood now.

## 2016-02-24 NOTE — H&P (Signed)
Patient ID: Alice Bond, female   DOB: 21-Jun-1929, 82 y.o.   MRN: [id number]  HPI Alice Bond is a 82 y.o. female asked to see by the emergency for starting onset of pain started 24 hours ago. Patient reports that the patient's severe intermittent and located mainly in the epigastric area. Pain is radiated to both flanks. She had multiple episodes of emesis and feels nauseated. The pain is severe and sharp in nature. She has never had anything like this before. Of note she denies any NSAID use or any family history significant for gastric cancer or peptic ulcer disease. His father died of lung cancer in his mother had a history of breast cancer. She is [age] but is very independent and lives with her husband. 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 No history of strokes no history of MIs and no previous abdominal operations. Only operation was right parotid surgery and a D&C. She is able to perform more her ADLs independently and she is competent. Her other workup included a white count of 16,000 with a left shift and a CT scan that I have personally reviewed and discussed with the radiologist. There is evidence of duodenal inflammatory response with extraluminal air and fluid highly suggestive of perforated ulcer. At she does have some cystic lesion in detail pancreas and has not changed in the past. I will we also discussed about the MRI that she previously had an at the MRI she did not have any duodenal pathology.  HPI  History reviewed. No pertinent past medical history.  Past Surgical History:  Procedure Laterality Date  . DILATION AND CURETTAGE OF UTERUS    . NECK SURGERY      Family History  Problem Relation Age of Onset  . Breast cancer Mother [id number]  . Cancer Mother   . Breast cancer Maternal Aunt 1261    Social History Social History  Substance Use Topics  . Smoking status: Never Smoker  . Smokeless tobacco: Never  Used  . Alcohol use Yes     Comment: seldom    No Known Allergies  Current Facility-Administered Medications  Medication Dose Route Frequency Provider Last Rate Last Dose  . famotidine (PEPCID) IVPB 20 mg premix  20 mg Intravenous Once Jennye MoccasinBrian S Quigley, MD 100 mL/hr at 02/24/16 2002 20 mg at 02/24/16 2002   Current Outpatient Prescriptions  Medication Sig Dispense Refill  . acetaminophen (TYLENOL) 500 MG tablet Take by mouth.    . Calcium Citrate-Vitamin D (CALCIUM CITRATE + D) 315-250 MG-UNIT TABS Take by mouth.    Marland Kitchen. ketoconazole (NIZORAL) 2 % cream Apply topically.    . latanoprost (XALATAN) 0.005 % ophthalmic solution Apply to eye.    . metFORMIN (GLUCOPHAGE-XR) 500 MG 24 hr tablet Take by mouth.    . tolterodine (DETROL) 1 MG tablet     . triamcinolone (KENALOG) 0.1 % paste Apply topically.    . metroNIDAZOLE (METROCREAM) 0.75 % cream Apply topically.    . Multiple Vitamin (MULTI-VITAMINS) TABS Take by mouth.    . trolamine salicylate (ASPERCREME) 10 % cream Apply topically.       Review of Systems A 10 point review of systems was asked and was negative except for the information on the HPI  Physical Exam Blood pressure (!) 106/54, pulse 85, temperature 98.4 F (36.9 C), temperature source Oral, resp. rate 20, height 5\' 7"  (1.702 m), weight 74.8 kg (  165 lb), SpO2 95 %. CONSTITUTIONAL: NAD EYES: Pupils are equal, round, and reactive to light, Sclera are non-icteric. EARS, NOSE, MOUTH AND THROAT: The oropharynx is clear. The oral mucosa is pink and moist. Hearing is intact to voice. LYMPH NODES:  Lymph nodes in the neck are normal. RESPIRATORY:  Lungs are clear. There is normal respiratory effort, with equal breath sounds bilaterally, and without pathologic use of accessory muscles. CARDIOVASCULAR: Heart is regular without murmurs, gallops, or rubs. GI: The abdomen is  Soft,Tender to palpation w focal peritonitis in the epigastric area and rebound tenderness. GU: Rectal  deferred.   MUSCULOSKELETAL: Normal muscle strength and tone. No cyanosis or edema.   SKIN: Turgor is good and there are no pathologic skin lesions or ulcers. NEUROLOGIC: Motor and sensation is grossly normal. Cranial nerves are grossly intact. PSYCH:  Oriented to person, place and time. Affect is normal.  Data Reviewed  I have personally reviewed the patient's imaging, laboratory findings and medical records.    Assessment Plan 82 year old female with perforated ulcer with significant inflammatory response around the duodenum and free air that is extraluminally. Discussed with the patient in detail she does have a white count of 16,000 and significant tenderness on physical examination. I do recommend proceeding to the operating room for exploratory laparotomy and repair of his perforated ulcer. I another alternative that is not necessarily an my best option would be medical management with IV antibiotics and a PPI. After extensive discussion about the pros and cons of each potential treatment she has agreed to proceed with exploratory laparotomy. Procedure discussed with the patient in detail. Risks, benefits and possible complications including but not limited to: Bleeding, infection, re-interventions, chronic pain, anesthetic complications, need for nursing home preoperatively, major injury treatment abdominal structures. She understands and she wishes to proceed extensive counseling provided  Sterling Bigiego Morgan Keinath, MD FACS General Surgeon 02/24/2016, 8:03 PM

## 2016-02-24 NOTE — ED Notes (Signed)
Verified with lab they had blood work for pt - per tech received at [employer]2pm

## 2016-02-24 NOTE — Progress Notes (Signed)
Pharmacy Antibiotic Note  Alice Bond is a 82 y.o. female admitted on 02/24/2016 with duodenal perforation.  Pharmacy has been consulted for zosyn dosing.  Plan: Zosyn 3.375 grams q 8 hours ordered.  Height: 5\' 7"  (170.2 cm) Weight: 165 lb (74.8 kg) IBW/kg (Calculated) : 61.6  Temp (24hrs), Avg:97.9 F (36.6 C), Min:96.8 F (36 C), Max:98.4 F (36.9 C)   Recent Labs Lab 02/24/16 1707 02/24/16 1918  WBC  --  11.7*  CREATININE 0.79  --     Estimated Creatinine Clearance: 55.3 mL/min (by C-G formula based on SCr of 0.79 mg/dL).    No Known Allergies  Antimicrobials this admission: Cefazolin x1  >>  Zosyn 11/17  >>   Dose adjustments this admission:   Microbiology results:   Thank you for allowing pharmacy to be a part of this patient'Bond care.  Alice Bond 02/24/2016 11:43 PM

## 2016-02-24 NOTE — Anesthesia Preprocedure Evaluation (Signed)
Anesthesia Evaluation  Patient identified by MRN, date of birth, ID band Patient awake    Reviewed: Allergy & Precautions, H&P , NPO status , Patient's Chart, lab work & pertinent test results  History of Anesthesia Complications Negative for: history of anesthetic complications  Airway Mallampati: II  TM Distance: <3 FB Neck ROM: limited    Dental no notable dental hx. (+) Poor Dentition, Chipped, Caps   Pulmonary neg pulmonary ROS, neg shortness of breath,    Pulmonary exam normal breath sounds clear to auscultation       Cardiovascular Exercise Tolerance: Good (-) angina(-) Past MI and (-) DOE negative cardio ROS Normal cardiovascular exam Rhythm:regular Rate:Normal     Neuro/Psych  Neuromuscular disease negative psych ROS   GI/Hepatic negative GI ROS, Neg liver ROS,   Endo/Other  negative endocrine ROS  Renal/GU Renal disease     Musculoskeletal  (+) Arthritis ,   Abdominal   Peds  Hematology negative hematology ROS (+)   Anesthesia Other Findings Free Air in abdomen  Past Surgical History: No date: DILATION AND CURETTAGE OF UTERUS No date: NECK SURGERY  BMI    Body Mass Index:  25.84 kg/m      Reproductive/Obstetrics negative OB ROS                             Anesthesia Physical Anesthesia Plan  ASA: IV  Anesthesia Plan: General ETT   Post-op Pain Management:    Induction:   Airway Management Planned:   Additional Equipment:   Intra-op Plan:   Post-operative Plan:   Informed Consent: I have reviewed the patients History and Physical, chart, labs and discussed the procedure including the risks, benefits and alternatives for the proposed anesthesia with the patient or authorized representative who has indicated his/her understanding and acceptance.   Dental Advisory Given  Plan Discussed with: Anesthesiologist, CRNA and Surgeon  Anesthesia Plan Comments:          Anesthesia Quick Evaluation

## 2016-02-24 NOTE — Op Note (Signed)
PROCEDURES: 1. Exploratory laparotomy 2. Primary Repair of duodenal perforation  3. Placement of feeding jejunostomy 16 FR 4. Incisional biopsy of duodenum  Pre-operative Diagnosis: Perforated duodenal ulcer  Post-operative Diagnosis: Same  Surgeon: Merri Rayiego F Gloyd Happ    Anesthesia: General endotracheal anesthesia  ASA Class: 3   Surgeon: Alice Bigiego Kaiyah Eber , MD FACS  Anesthesia: Gen. with endotracheal tube  Findings: 1. Contained perforation and second portion of the duodenum with large necrotic cavity and with significant periduodenal inflammation as well as pancreatic inflammation.  Estimated Blood Loss: 200cc         Drains: # 19 blake drains. Right posterior to duodenum and left anterior to duodenum         Specimens:    Duodenal biopsy of perforation       Complications: none                Condition: stable   Procedure Details  The patient was seen again in the Holding Room. The benefits, complications, treatment options, and expected outcomes were discussed with the patient. The risks of bleeding, infection, recurrence of symptoms, failure to resolve symptoms,  bowel injury, any of which could require further surgery were reviewed with the patient.   The patient was taken to Operating Room, identified as Alice Bond and the procedure verified.  A Time Out was held and the above information confirmed.  Prior to the induction of general anesthesia, antibiotic prophylaxis was administered. VTE prophylaxis was in place. General endotracheal anesthesia was then administered and tolerated well. After the induction, the abdomen was prepped with Chloraprep and draped in the sterile fashion. The patient was positioned in the supine position.  Midline laparotomy performed in the standard fashion. There was no evidence of major free contamination into the abdominal cavity. And there was however significant inflammatory response in the second portion of the duodenum with some thick  exudate. And we were able to kocherized the duodenum and enter the cavity that was posterior to the second portion of the duodenum. The was was a significant cavity measuring around centimeters in diameter with necrotic tissue. We were able to identify the duodenal perforation on the posterior wall of the second portion of the duodenum. We debrided the edges of the perforation and took an incisional biopsy of the ulcer edges, we did repair the perforation in a two  layer fashion with interrupted 2- 0 silks. We were able to mobilize the omentum and and place the tongue on the posterior aspect of the duodenum to reinforce the repair. I will also make sure there was no evidence of any leak of biliary material and we were very careful to identify and to avoid any injury to the pancreas or to the common bile duct. We encountered some bleeding probably from the gastroduodenal branches that were able to suture ligate in the standard fashion. We irrigated the abdominal cavity with normal saline and then turned or attention to the jejunum. An we perform a Witzel  jejunostomy using a #16 [place] and red rubber catheter in the standard fashion. Catheter was attached in 4 quadrants to the abdominal wall. 2 #19 Blake drains were placed one posterior to duodenum and the other one anterior to the duodenum and sutured in place with a 3-0 nylon. The rest of the abdominal cavity didn't show any evidence of any other injuries or pathology. Liposomal Marcaine was injected  Along the  incision sites under direct visualization. Fascia was closed using a 0 PDS suture  in a continuous fashion. Skin was closed with staples after irrigating the subcutaneous tissue. Needle and laparotomy count were correct and there were no immediate occasions  Alice Bigiego Donoven Pett, MD, FACS

## 2016-02-24 NOTE — ED Triage Notes (Addendum)
Pt via pov from PCP office. She was sent here for workup. Her WBC indicated an infection, and he wanted more detailed testing in ED. Possiblity of diverticulitis was mentioned. Pt states she began having abdominal pain yesterday and started vomiting last night after dinner and vomited throughout the night. She last vomited at [facility]5AM. She mentions that she was disoriented this morning, had some trouble finding her words. NAD noted.

## 2016-02-24 NOTE — Anesthesia Procedure Notes (Signed)
Procedure Name: Intubation Performed by: Malva CoganBEANE, Marcine Gadway Pre-anesthesia Checklist: Patient identified, Patient being monitored, Timeout performed, Emergency Drugs available and Suction available Patient Re-evaluated:Patient Re-evaluated prior to inductionOxygen Delivery Method: Circle system utilized Preoxygenation: Pre-oxygenation with 100% oxygen Intubation Type: IV induction, Rapid sequence and Cricoid Pressure applied Laryngoscope Size: 3 and Glidescope Grade View: Grade I Tube type: Oral Tube size: 7.0 mm Number of attempts: 1 Airway Equipment and Method: Rigid stylet Placement Confirmation: ETT inserted through vocal cords under direct vision,  positive ETCO2 and breath sounds checked- equal and bilateral Secured at: 22 cm Tube secured with: Tape Dental Injury: Teeth and Oropharynx as per pre-operative assessment

## 2016-02-25 ENCOUNTER — Encounter: Payer: Self-pay | Admitting: Surgery

## 2016-02-25 LAB — CBC
HCT: 36.1 % (ref 35.0–47.0)
Hemoglobin: 12.3 g/dL (ref 12.0–16.0)
MCH: 30.4 pg (ref 26.0–34.0)
MCHC: 34.1 g/dL (ref 32.0–36.0)
MCV: 89.1 fL (ref 80.0–100.0)
Platelets: 123 10*3/uL — ABNORMAL LOW (ref 150–440)
RBC: 4.05 MIL/uL (ref 3.80–5.20)
RDW: 14.5 % (ref 11.5–14.5)
WBC: 10.8 10*3/uL (ref 3.6–11.0)

## 2016-02-25 LAB — BASIC METABOLIC PANEL
Anion gap: 6 (ref 5–15)
BUN: 18 mg/dL (ref 6–20)
CHLORIDE: 103 mmol/L (ref 101–111)
CO2: 28 mmol/L (ref 22–32)
CREATININE: 0.83 mg/dL (ref 0.44–1.00)
Calcium: 8.4 mg/dL — ABNORMAL LOW (ref 8.9–10.3)
GFR calc non Af Amer: >60 mL/min (ref >60)
Glucose, Bld: 192 mg/dL — ABNORMAL HIGH (ref 65–99)
POTASSIUM: 3.8 mmol/L (ref 3.5–5.1)
Sodium: 137 mmol/L (ref 135–145)

## 2016-02-25 LAB — PHOSPHORUS: Phosphorus: 1.5 mg/dL — ABNORMAL LOW (ref 2.5–4.6)

## 2016-02-25 LAB — MAGNESIUM: Magnesium: 1.8 mg/dL (ref 1.7–2.4)

## 2016-02-25 LAB — PROTIME-INR
INR: 1.19
PROTHROMBIN TIME: 15.2 s (ref 11.4–15.2)

## 2016-02-25 MED ORDER — PIPERACILLIN-TAZOBACTAM 3.375 G IVPB
3.3750 g | Freq: Three times a day (TID) | INTRAVENOUS | Status: DC
  Administered 2016-02-25 – 2016-03-03 (×22): 3.375 g via INTRAVENOUS
  Filled 2016-02-25 (×22): qty 50

## 2016-02-25 MED ORDER — MENTHOL 3 MG MT LOZG
1.0000 | LOZENGE | OROMUCOSAL | Status: DC | PRN
  Administered 2016-02-25 – 2016-02-27 (×4): 3 mg via ORAL
  Filled 2016-02-25 (×2): qty 9

## 2016-02-25 MED ORDER — VITAL AF 1.2 CAL PO LIQD
1000.0000 mL | ORAL | Status: DC
  Administered 2016-02-26 – 2016-03-02 (×7): 1000 mL

## 2016-02-25 MED ORDER — JEVITY 1.2 CAL PO LIQD: 1000.0000 mL | ORAL | Status: DC

## 2016-02-25 MED ORDER — ORAL CARE MOUTH RINSE
15.0000 mL | Freq: Two times a day (BID) | OROMUCOSAL | Status: DC
  Administered 2016-02-25 – 2016-03-05 (×16): 15 mL via OROMUCOSAL

## 2016-02-25 NOTE — Anesthesia Postprocedure Evaluation (Signed)
Anesthesia Post Note  Patient: Alice HaggisJanice Holm Bond  Procedure(s) Performed: Procedure(s) (LRB): EXPLORATORY LAPAROTOMY with repair of duodenal perforation (N/A)  Patient location during evaluation: PACU Anesthesia Type: General Level of consciousness: awake and alert Pain management: pain level controlled Vital Signs Assessment: post-procedure vital signs reviewed and stable Respiratory status: spontaneous breathing, nonlabored ventilation, respiratory function stable and patient connected to nasal cannula oxygen Cardiovascular status: blood pressure returned to baseline and stable Postop Assessment: no signs of nausea or vomiting Anesthetic complications: no    Last Vitals:  Vitals:   02/25/16 0204 02/25/16 0216  BP:  (!) 114/54  Pulse:  67  Resp: 17 20  Temp:  36.6 C    Last Pain:  Vitals:   02/25/16 0216  TempSrc: Oral  PainSc:                  Cleda MccreedyJoseph K Piscitello

## 2016-02-25 NOTE — Progress Notes (Signed)
Incentive spirometer given to the pt and explain the importance of the usage. Pt demonstrated proper usage with teach back receiving 250 with a goal of 500. Will continue to encourage the usage of the incentive spirometer.   Alice Bond

## 2016-02-25 NOTE — Progress Notes (Signed)
Inpatient Diabetes Program Recommendations  AACE/ADA: New Consensus Statement on Inpatient Glycemic Control (2015)  Target Ranges:  Prepandial:   less than 140 mg/dL      Peak postprandial:   less than 180 mg/dL (1-2 hours)      Critically ill patients:  140 - 180 mg/dL   No results found for: GLUCAP, HGBA1C 6.3% on 01/27/16 in Care Everywhere  Review of Glycemic Control  Results for Alice Bond, Alice Bond (MRN [id number]) as of 02/25/2016 12:24  Ref. Range 12/15/2015 09:54 02/24/2016 17:07 02/24/2016 18:39 02/24/2016 19:18 02/25/2016 04:56  Glucose Latest Ref Range: 65 - 99 mg/dL  914128 (H)   782192 (H)      Diabetes history: none noted but A1C as high as 6.4% in Care Everywhere Outpatient Diabetes medications: Glucophage 500mg /day Current orders for Inpatient glycemic control: none  Inpatient Diabetes Program Recommendations:  Elevated lab glucose this am- please consider ordering CBG tid and hs  Alice RacerJulie Gerda Yin, RN, [place], [place]HA, CDE Diabetes Coordinator Inpatient Glycemic Control Team [phone] (Team Pager) [phone] [facility] Office) 02/25/2016 12:26 PM

## 2016-02-25 NOTE — Progress Notes (Signed)
1 Day Post-Op   Subjective:  Patient reports doing well. Her pain is well-controlled. She's not really having to use her PCA. She's been very happy with her parents thus far. She understands clearly what happened.  Vital signs in last 24 hours: Temp:  [96.8 F (36 C)-98.4 F (36.9 C)] 97.9 F (36.6 C) (11/17 0505) Pulse Rate:  [67-85] 71 (11/17 0505) Resp:  [14-29] 18 (11/17 0800) BP: (106-140)/(54-111) 127/65 (11/17 0505) SpO2:  [88 %-98 %] 96 % (11/17 0800) Weight:  [74.8 kg (165 lb)-79.4 kg (175 lb 1.6 oz)] 79.4 kg (175 lb 1.6 oz) (11/17 0052) Last BM Date: 02/23/16  Intake/Output from previous day: 11/16 0701 - 11/17 0700 In: 3141 [I.V.:3141] Out: 1225 [Urine:775; Emesis/NG output:100; Drains:150; Blood:200]  GI: Abdomen is soft, appropriately tender to palpation at the incision sites, JP drains in place draining a serosanguineous fluid, jejunostomy tube in place currently not accessed.  Lab Results:  CBC  Recent Labs  02/24/16 1918 02/25/16 0456  WBC 11.7* 10.8  HGB 12.7 12.3  HCT 39.6 36.1  PLT 143* 123*   CMP     Component Value Date/Time   NA 137 02/25/2016 0456   K 3.8 02/25/2016 0456   CL 103 02/25/2016 0456   CO2 28 02/25/2016 0456   GLUCOSE 192 (H) 02/25/2016 0456   BUN 18 02/25/2016 0456   CREATININE 0.83 02/25/2016 0456   CALCIUM 8.4 (L) 02/25/2016 0456   PROT 6.2 (L) 02/24/2016 1707   ALBUMIN 3.2 (L) 02/24/2016 1707   AST 45 (H) 02/24/2016 1707   ALT 31 02/24/2016 1707   ALKPHOS 52 02/24/2016 1707   BILITOT 0.8 02/24/2016 1707   GFRNONAA >60 02/25/2016 0456   GFRAA >60 02/25/2016 0456   PT/INR  Recent Labs  02/25/16 0456  LABPROT 15.2  INR 1.19    Studies/Results: Ct Abdomen Pelvis W Contrast  Result Date: 02/24/2016 CLINICAL DATA:  Abdominal pain. Elevated white blood cell count. Vomiting. EXAM: CT ABDOMEN AND PELVIS WITH CONTRAST TECHNIQUE: Multidetector CT imaging of the abdomen and pelvis was performed using the standard protocol  following bolus administration of intravenous contrast. CONTRAST:  100mL ISOVUE-300 IOPAMIDOL (ISOVUE-300) IV COMPARISON:  Abdominal MRI 12/15/2015 FINDINGS: Lower chest: Streaky dependent lower lobe opacities, right greater than left, likely hypoventilatory atelectasis. No pleural fluid. Coronary artery calcifications are seen. Hepatobiliary: Calcified gallstones within physiologically distended gallbladder. Soft tissue stranding in the right upper quadrant felt to be secondary to duodenal process, less likely gallbladder inflammation, soft tissue stranding does not appear centered in the gallbladder. Subcapsular 8 mm lesion in the left lobe of the liver is likely a simple cyst. No biliary dilatation. Pancreas: Cystic pancreatic tail lesion measures 3.5 x 1.1 cm with probable internal septations. This is likely unchanged in size from prior MRI allowing for differences in caliper placement and modality. No main pancreatic ductal dilatation. No peripancreatic inflammation. Spleen: Normal in size without focal abnormality. Adrenals/Urinary Tract: Horseshoe kidney with hydronephrosis of the left renal moiety, chronic. The degree of hydronephrosis may have is not significantly changed. No hydronephrosis of the right renal moiety. No evidence urolithiasis. Urinary bladder is physiologically distended. There is no adrenal nodule. Stomach/Bowel: Stomach is physiologically distended with enteric contrast. There is duodenal wall thickening involving the second and third portion of the duodenum with irregular extraluminal air in fluid adjacent to the duodenum lobe. Adjacent soft tissue stranding is seen. Remaining small bowel is normal in caliber. There is enteric contrast within the ascending and transverse colon. Moderate  stool burden. The appendix is normal. Vascular/Lymphatic: Tortuous intra-abdominal vessels with mild atherosclerosis. No aneurysm. Small retroperitoneal lymph nodes, not enlarged by size criteria. No  pelvic adenopathy. Reproductive: Atrophic uterus with calcifications, may be combination of fibroids and vascular. No adnexal mass. Other: No ascites. Musculoskeletal: Scoliosis and degenerative change in the spine. Chronic appearing compression deformity of L1. IMPRESSION: 1. Periduodenal inflammation with extraluminal fluid and air concerning for duodenum perforation, possible duodenal ulcer. 2. Horseshoe kidney with chronic left hydronephrosis. 3. 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 4. Cholelithiasis. Soft tissue stranding in the right upper quadrant felt to be secondary to duodenal inflammation, and is not centered on the gallbladder. Critical Value/emergent results were called by telephone at the time of interpretation on 02/24/2016 at 6:56 pm to Dr. Fanny BienQuale , who verbally acknowledged these results. Electronically Signed   By: Rubye OaksMelanie  Ehinger M.D.   On: 02/24/2016 18:59    Assessment/Plan: 82 year old female status post export her laparotomy with repair of duodenal ulcer. Discussed the importance of keeping her NG tube to suction. Also discussed that we would likely start feeding her jejunostomy tube either today or tomorrow depending on how she does. Encourage incentive spirometer usage, physical activity.   Ricarda Frameharles Shacora Zynda, MD FACS General Surgeon  02/25/2016

## 2016-02-25 NOTE — Care Management (Signed)
Patient admitted post op for Perforated duodenal ulcer reported that patient lives at home with her husband.  Patient has NG, new j tube, 2 jp drains, and PCA.  PCP Thies. PT consult has been requested for when patient medically stable.  RNCM following.

## 2016-02-25 NOTE — Progress Notes (Signed)
Initial Nutrition Assessment  DOCUMENTATION CODES:   Not applicable  INTERVENTION:  -Dietitian Consult received for TF. Recommend starting Vital 1.2 AF at rate of 20 ml/hr with goal of 65 ml/hr providing 1862 kcals, 117 g protein and 1264 mL of free water. Once off IV fluids, pt will need additional free water flushes of at least 30 ml/hr (maintenance flushes at present). Discussed plan with MD Tonita CongWoodham. MD would like for tube feedings to begin tomorrow (11/17) at 8:00. All orders placed with future start date. Will continue to assess -Recommend supplementing phosphorus (1.5 today)  NUTRITION DIAGNOSIS:   Inadequate oral intake related to acute illness as evidenced by NPO status.  GOAL:   Patient will meet greater than or equal to 90% of their needs  MONITOR:   TF tolerance, Labs, Weight trends, I & O's  REASON FOR ASSESSMENT:   Consult Enteral/tube feeding initiation and management  ASSESSMENT:   82 yo female admitted with perforated duodenal ulcer s/p repair and placement of J-tube for nutrition on 11/16  History reviewed. No pertinent past medical history.   Labs: phosphorus 1.5 Meds: D5-LR at 125 ml/hr (510 kcals in 24 hours)  Diet Order:  Diet NPO time specified Except for: Ice Chips  Skin:  Reviewed, no issues  Last BM:  11/15  Height:   Ht Readings from Last 1 Encounters:  02/25/16 5\' 7"  (1.702 m)    Weight:   Wt Readings from Last 1 Encounters:  02/25/16 175 lb 1.6 oz (79.4 kg)    Filed Weights   02/24/16 1252 02/25/16 0052  Weight: 165 lb (74.8 kg) 175 lb 1.6 oz (79.4 kg)    BMI:  Body mass index is 27.42 kg/m.  Estimated Nutritional Needs:   Kcal:  1700-2000 kcals  Protein:  85-100 g  Fluid:  >/= 2 L  EDUCATION NEEDS:   No education needs identified at this time  Romelle StarcherCate Louis Ivery MS, RD, LDN [phone] Pager  [phone] Weekend/On-Call Pager

## 2016-02-25 NOTE — Care Management Important Message (Signed)
Important Message  Patient Details  Name: Alice Bond MRN: [id number] Date of Birth: 06-Jan-1930   Medicare Important Message Given:  Yes    Chapman FitchBOWEN, Alison Kubicki T, RN 02/25/2016, 9:57 AM

## 2016-02-26 LAB — CBC
HEMATOCRIT: 36.6 % (ref 35.0–47.0)
HEMOGLOBIN: 12 g/dL (ref 12.0–16.0)
MCH: 29.2 pg (ref 26.0–34.0)
MCHC: 32.9 g/dL (ref 32.0–36.0)
MCV: 88.8 fL (ref 80.0–100.0)
PLATELETS: 128 10*3/uL — AB (ref 150–440)
RBC: 4.13 MIL/uL (ref 3.80–5.20)
RDW: 14.4 % (ref 11.5–14.5)
WBC: 9.4 10*3/uL (ref 3.6–11.0)

## 2016-02-26 LAB — BASIC METABOLIC PANEL
ANION GAP: 4 — AB (ref 5–15)
BUN: 11 mg/dL (ref 6–20)
CHLORIDE: 104 mmol/L (ref 101–111)
CO2: 31 mmol/L (ref 22–32)
Calcium: 8 mg/dL — ABNORMAL LOW (ref 8.9–10.3)
Creatinine, Ser: 0.85 mg/dL (ref 0.44–1.00)
GFR calc Af Amer: >60 mL/min (ref >60)
GLUCOSE: 147 mg/dL — AB (ref 65–99)
Potassium: 3.7 mmol/L (ref 3.5–5.1)
Sodium: 139 mmol/L (ref 135–145)

## 2016-02-26 LAB — GLUCOSE, CAPILLARY
GLUCOSE-CAPILLARY: 133 mg/dL — AB (ref 65–99)
GLUCOSE-CAPILLARY: 163 mg/dL — AB (ref 65–99)
Glucose-Capillary: 109 mg/dL — ABNORMAL HIGH (ref 65–99)
Glucose-Capillary: 135 mg/dL — ABNORMAL HIGH (ref 65–99)

## 2016-02-26 LAB — MAGNESIUM: Magnesium: 1.9 mg/dL (ref 1.7–2.4)

## 2016-02-26 LAB — PHOSPHORUS: Phosphorus: 1.1 mg/dL — ABNORMAL LOW (ref 2.5–4.6)

## 2016-02-26 MED ORDER — KCL IN DEXTROSE-NACL 20-5-0.2 MEQ/L-%-% IV SOLN
INTRAVENOUS | Status: DC
  Administered 2016-02-26 – 2016-02-29 (×6): via INTRAVENOUS
  Filled 2016-02-26 (×8): qty 1000

## 2016-02-26 NOTE — Plan of Care (Signed)
Problem: Activity: Goal: Risk for activity intolerance will decrease Outcome: Progressing Pt ambulated out in hallway.  Problem: Nutrition: Goal: Adequate nutrition will be maintained Outcome: Progressing Pt is on tube feedings, orders to adjust rate to meet goal of 3165ml/hr.

## 2016-02-26 NOTE — Progress Notes (Signed)
Subjective:   She is lying comfortably in bed. She has no complaints except incisional pain and the number of intravenous devices she has her bedside. She is not nauseated. She is tolerating some ice chips. Her labs are improved. She has moderate drainage from her NG and her JP drains. We are starting enteral feedings this morning.  Vital signs in last 24 hours: Temp:  [98.3 F (36.8 C)-98.5 F (36.9 C)] 98.5 F (36.9 C) (11/18 0554) Pulse Rate:  [71-79] 72 (11/18 0554) Resp:  [16-20] 17 (11/18 0800) BP: (108-135)/(55-93) 108/93 (11/18 0554) SpO2:  [91 %-97 %] 93 % (11/18 0800) Weight:  [82.9 kg (182 lb 12.8 oz)] 82.9 kg (182 lb 12.8 oz) (11/18 0554) Last BM Date: 02/23/16  Intake/Output from previous day: 11/17 0701 - 11/18 0700 In: 3511.5 [I.V.:3351.5; NG/GT:30; IV Piggyback:130] Out: 2060 [Urine:1675; Emesis/NG output:260; Drains:125]  Exam:  There was some bile staining on her dressings but the dressings after removal did not demonstrate any evidence of a leak. The drainage from her JPs is clear. She does have good bowel sounds.  Lab Results:  CBC  Recent Labs  02/25/16 0456 02/26/16 0517  WBC 10.8 9.4  HGB 12.3 12.0  HCT 36.1 36.6  PLT 123* 128*   CMP     Component Value Date/Time   NA 139 02/26/2016 0517   K 3.7 02/26/2016 0517   CL 104 02/26/2016 0517   CO2 31 02/26/2016 0517   GLUCOSE 147 (H) 02/26/2016 0517   BUN 11 02/26/2016 0517   CREATININE 0.85 02/26/2016 0517   CALCIUM 8.0 (L) 02/26/2016 0517   PROT 6.2 (L) 02/24/2016 1707   ALBUMIN 3.2 (L) 02/24/2016 1707   AST 45 (H) 02/24/2016 1707   ALT 31 02/24/2016 1707   ALKPHOS 52 02/24/2016 1707   BILITOT 0.8 02/24/2016 1707   GFRNONAA >60 02/26/2016 0517   GFRAA >60 02/26/2016 0517   PT/INR  Recent Labs  02/25/16 0456  LABPROT 15.2  INR 1.19    Studies/Results: Ct Abdomen Pelvis W Contrast  Result Date: 02/24/2016 CLINICAL DATA:  Abdominal pain. Elevated white blood cell count. Vomiting.  EXAM: CT ABDOMEN AND PELVIS WITH CONTRAST TECHNIQUE: Multidetector CT imaging of the abdomen and pelvis was performed using the standard protocol following bolus administration of intravenous contrast. CONTRAST:  100mL ISOVUE-300 IOPAMIDOL (ISOVUE-300) IV COMPARISON:  Abdominal MRI 12/15/2015 FINDINGS: Lower chest: Streaky dependent lower lobe opacities, right greater than left, likely hypoventilatory atelectasis. No pleural fluid. Coronary artery calcifications are seen. Hepatobiliary: Calcified gallstones within physiologically distended gallbladder. Soft tissue stranding in the right upper quadrant felt to be secondary to duodenal process, less likely gallbladder inflammation, soft tissue stranding does not appear centered in the gallbladder. Subcapsular 8 mm lesion in the left lobe of the liver is likely a simple cyst. No biliary dilatation. Pancreas: Cystic pancreatic tail lesion measures 3.5 x 1.1 cm with probable internal septations. This is likely unchanged in size from prior MRI allowing for differences in caliper placement and modality. No main pancreatic ductal dilatation. No peripancreatic inflammation. Spleen: Normal in size without focal abnormality. Adrenals/Urinary Tract: Horseshoe kidney with hydronephrosis of the left renal moiety, chronic. The degree of hydronephrosis may have is not significantly changed. No hydronephrosis of the right renal moiety. No evidence urolithiasis. Urinary bladder is physiologically distended. There is no adrenal nodule. Stomach/Bowel: Stomach is physiologically distended with enteric contrast. There is duodenal wall thickening involving the second and third portion of the duodenum with irregular extraluminal air in fluid  adjacent to the duodenum lobe. Adjacent soft tissue stranding is seen. Remaining small bowel is normal in caliber. There is enteric contrast within the ascending and transverse colon. Moderate stool burden. The appendix is normal. Vascular/Lymphatic:  Tortuous intra-abdominal vessels with mild atherosclerosis. No aneurysm. Small retroperitoneal lymph nodes, not enlarged by size criteria. No pelvic adenopathy. Reproductive: Atrophic uterus with calcifications, may be combination of fibroids and vascular. No adnexal mass. Other: No ascites. Musculoskeletal: Scoliosis and degenerative change in the spine. Chronic appearing compression deformity of L1. IMPRESSION: 1. Periduodenal inflammation with extraluminal fluid and air concerning for duodenum perforation, possible duodenal ulcer. 2. Horseshoe kidney with chronic left hydronephrosis. 3. 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 4. Cholelithiasis. Soft tissue stranding in the right upper quadrant felt to be secondary to duodenal inflammation, and is not centered on the gallbladder. Critical Value/emergent results were called by telephone at the time of interpretation on 02/24/2016 at 6:56 pm to Dr. Fanny BienQuale , who verbally acknowledged these results. Electronically Signed   By: Rubye OaksMelanie  Ehinger M.D.   On: 02/24/2016 18:59    Assessment/Plan: She appears to be improving. I do not see any significant problems currently. We will adjust her IV fluid with this introduction of her enteral feedings. We will continue to follow her on the present course.

## 2016-02-26 NOTE — Progress Notes (Signed)
Tube feedings initiated per MD order at 6420ml/hr. With q4 flushes of sterile water programmed in pump.

## 2016-02-26 NOTE — Progress Notes (Signed)
Tube feeding rate adjusted to 1630ml/hr per order. Pt is tolerating well.

## 2016-02-27 LAB — GLUCOSE, CAPILLARY
GLUCOSE-CAPILLARY: 121 mg/dL — AB (ref 65–99)
GLUCOSE-CAPILLARY: 131 mg/dL — AB (ref 65–99)
GLUCOSE-CAPILLARY: 151 mg/dL — AB (ref 65–99)
Glucose-Capillary: 135 mg/dL — ABNORMAL HIGH (ref 65–99)
Glucose-Capillary: 136 mg/dL — ABNORMAL HIGH (ref 65–99)
Glucose-Capillary: 152 mg/dL — ABNORMAL HIGH (ref 65–99)
Glucose-Capillary: 157 mg/dL — ABNORMAL HIGH (ref 65–99)

## 2016-02-27 NOTE — Progress Notes (Signed)
NGT removed per MD order.

## 2016-02-27 NOTE — Progress Notes (Signed)
Subjective:   She feels better today. Her pain control was improved. She's been out of bed in a chair and ambulating in the room. She denies any nausea or vomiting. She's not passed any gas as yet. Her labs remained stable although her potassium is 3.7. She seems to be tolerating her tube feeding well.  Vital signs in last 24 hours: Temp:  [98.1 F (36.7 C)-98.4 F (36.9 C)] 98.4 F (36.9 C) (11/19 0506) Pulse Rate:  [74-78] 78 (11/19 0506) Resp:  [17-22] 19 (11/19 0835) BP: (114-130)/(51-63) 124/63 (11/19 0506) SpO2:  [93 %-96 %] 94 % (11/19 0835) Weight:  [80.8 kg (178 lb 3.2 oz)] 80.8 kg (178 lb 3.2 oz) (11/19 0500) Last BM Date: 02/23/16  Intake/Output from previous day: 11/18 0701 - 11/19 0700 In: 3214 [I.V.:2526; NG/GT:598] Out: 2469.5 [Urine:2050; Emesis/NG output:300; Drains:119.5]  Exam:  Her abdomen looks good. There is no sign of any wound drainage. The JP drainage is still serosanguineous with no evidence of any bile.  Lab Results:  CBC  Recent Labs  02/25/16 0456 02/26/16 0517  WBC 10.8 9.4  HGB 12.3 12.0  HCT 36.1 36.6  PLT 123* 128*   CMP     Component Value Date/Time   NA 139 02/26/2016 0517   K 3.7 02/26/2016 0517   CL 104 02/26/2016 0517   CO2 31 02/26/2016 0517   GLUCOSE 147 (H) 02/26/2016 0517   BUN 11 02/26/2016 0517   CREATININE 0.85 02/26/2016 0517   CALCIUM 8.0 (L) 02/26/2016 0517   PROT 6.2 (L) 02/24/2016 1707   ALBUMIN 3.2 (L) 02/24/2016 1707   AST 45 (H) 02/24/2016 1707   ALT 31 02/24/2016 1707   ALKPHOS 52 02/24/2016 1707   BILITOT 0.8 02/24/2016 1707   GFRNONAA >60 02/26/2016 0517   GFRAA >60 02/26/2016 0517   PT/INR  Recent Labs  02/25/16 0456  LABPROT 15.2  INR 1.19    Studies/Results: No results found.  Assessment/Plan: She continues to improve. We'll discontinue her gastric suction today and tentatively plan discharge feeding her tomorrow. Overall she is progressing very nicely. She feels as though she cannot get out  of bed on regular basis so we will leave her Foley and one more day. I discussed this plan with the patient and her family.

## 2016-02-28 LAB — GLUCOSE, CAPILLARY
GLUCOSE-CAPILLARY: 145 mg/dL — AB (ref 65–99)
GLUCOSE-CAPILLARY: 133 mg/dL — AB (ref 65–99)
GLUCOSE-CAPILLARY: 149 mg/dL — AB (ref 65–99)
GLUCOSE-CAPILLARY: 168 mg/dL — AB (ref 65–99)
Glucose-Capillary: 133 mg/dL — ABNORMAL HIGH (ref 65–99)

## 2016-02-28 LAB — BASIC METABOLIC PANEL
ANION GAP: 5 (ref 5–15)
BUN: 15 mg/dL (ref 6–20)
CALCIUM: 7.9 mg/dL — AB (ref 8.9–10.3)
CO2: 27 mmol/L (ref 22–32)
CREATININE: 0.61 mg/dL (ref 0.44–1.00)
Chloride: 105 mmol/L (ref 101–111)
Glucose, Bld: 144 mg/dL — ABNORMAL HIGH (ref 65–99)
Potassium: 3.7 mmol/L (ref 3.5–5.1)
SODIUM: 137 mmol/L (ref 135–145)

## 2016-02-28 LAB — SURGICAL PATHOLOGY

## 2016-02-28 NOTE — Progress Notes (Signed)
Pharmacy Antibiotic Note  Alice HaggisJanice Holm Bond is a 82 y.o. female admitted on 02/24/2016 with duodenal perforation.  Pharmacy  consulted for zosyn dosing.  Plan: Continue Zosyn 3.375 grams q 8 hours as ordered.  Height: 5\' 7"  (170.2 cm) Weight: 180 lb 12.8 oz (82 kg) IBW/kg (Calculated) : 61.6  Temp (24hrs), Avg:98.7 F (37.1 C), Min:98.4 F (36.9 C), Max:99.1 F (37.3 C)   Recent Labs Lab 02/24/16 1707 02/24/16 1918 02/25/16 0456 02/26/16 0517 02/28/16 0510  WBC  --  11.7* 10.8 9.4  --   CREATININE 0.79  --  0.83 0.85 0.61    Estimated Creatinine Clearance: 57.7 mL/min (by C-G formula based on SCr of 0.61 mg/dL).    No Known Allergies  Antimicrobials this admission: Cefazolin x1  >>  Zosyn 11/17  >>   Dose adjustments this admission:   Microbiology results:   Thank you for allowing pharmacy to be a part of this patient's care.  Alice Bond D 02/28/2016 3:11 PM

## 2016-02-28 NOTE — Progress Notes (Signed)
4 Days Post-Op  Subjective: Patient feels well no nausea or vomiting passing gas. Nasogastric tube was removed yesterday  Objective: Vital signs in last 24 hours: Temp:  [98.4 F (36.9 C)-99.1 F (37.3 C)] 98.4 F (36.9 C) (11/20 1222) Pulse Rate:  [74-83] 77 (11/20 1222) Resp:  [14-24] 24 (11/20 1200) BP: (126-144)/(57-94) 144/65 (11/20 1222) SpO2:  [91 %-95 %] 94 % (11/20 1222) FiO2 (%):  [0 %] 0 % (11/20 0800) Weight:  [180 lb 12.8 oz (82 kg)] 180 lb 12.8 oz (82 kg) (11/20 0300) Last BM Date: 02/23/16  Intake/Output from previous day: 11/19 0701 - 11/20 0700 In: 2561.3 [I.V.:1481.8; NG/GT:999; IV Piggyback:80.5] Out: 2562.5 [Urine:2450; Emesis/NG output:50; Drains:62.5] Intake/Output this shift: Total I/O In: 448 [I.V.:109; Other:30; NG/GT:309] Out: -   Physical exam:  Abdomen is soft nondistended nontender wound is clean drains are in place. Nontender calves  Lab Results: CBC   Recent Labs  02/26/16 0517  WBC 9.4  HGB 12.0  HCT 36.6  PLT 128*   BMET  Recent Labs  02/26/16 0517 02/28/16 0510  NA 139 137  K 3.7 3.7  CL 104 105  CO2 31 27  GLUCOSE 147* 144*  BUN 11 15  CREATININE 0.85 0.61  CALCIUM 8.0* 7.9*   PT/INR No results for input(s): LABPROT, INR in the last 72 hours. ABG No results for input(s): PHART, HCO3 in the last 72 hours.  Invalid input(s): PCO2, PO2  Studies/Results: No results found.  Anti-infectives: Anti-infectives    Start     Dose/Rate Route Frequency Ordered Stop   02/25/16 0600  piperacillin-tazobactam (ZOSYN) IVPB 3.375 g     3.375 g 12.5 mL/hr over 240 Minutes Intravenous Every 8 hours 02/25/16 0023     02/24/16 2036  piperacillin-tazobactam (ZOSYN) 3.375 GM/50ML IVPB  Status:  Discontinued    Comments:  NEAL, SUSAN: cabinet override      02/24/16 2036 02/24/16 2341      Assessment/Plan: s/p Procedure(s): EXPLORATORY LAPAROTOMY with repair of duodenal perforation   Patient is tolerating tube feeds will  start clear liquids and continue tube feeds at this point  Lattie Hawichard E Cooper, MD, FACS  02/28/2016

## 2016-02-28 NOTE — Progress Notes (Signed)
Pt alert with husband in room. Pt was former Runner, broadcasting/film/videoteacher at [employer]. Husband in publishing. Both spoke of family relationshiops. CH offered prayer.   02/28/16 1135  Clinical Encounter Type  Visited With Patient and family together  Visit Type Initial  Referral From Nurse  Spiritual Encounters  Spiritual Needs Prayer;Emotional  Stress Factors  Patient Stress Factors None identified  Family Stress Factors None identified

## 2016-02-28 NOTE — Progress Notes (Signed)
Nutrition Follow-up  DOCUMENTATION CODES:   Not applicable  INTERVENTION:   -Phosphorus level 1.1 on 11/18. Recommend supplementing -Encourage po intake, diet progression per MD. Continue TF at present  NUTRITION DIAGNOSIS:   Inadequate oral intake related to acute illness as evidenced by NPO status.  Being addressed via TF  GOAL:   Patient will meet greater than or equal to 90% of their needs  MONITOR:   TF tolerance, Labs, Weight trends, I & O's  REASON FOR ASSESSMENT:   Consult Enteral/tube feeding initiation and management  ASSESSMENT:   82 yo female admitted with perforated duodenal ulcer s/p repair and placement of J-tube for nutrition on 11/16  Tolerating Vital AF 1.2 at rate of 65 ml/hr via J-tube. Diet advanced to CL today  Labs: phosphorus 1.1 (11/18) Meds: D5-1/2 NS with Kcl at 50 ml/hr  Diet Order:  Diet clear liquid Room service appropriate? Yes; Fluid consistency: Thin  Skin:  Reviewed, no issues  Last BM:  11/15  Height:   Ht Readings from Last 1 Encounters:  02/25/16 5\' 7"  (1.702 m)    Weight:   Wt Readings from Last 1 Encounters:  02/28/16 180 lb 12.8 oz (82 kg)    Ideal Body Weight:     BMI:  Body mass index is 28.32 kg/m.  Estimated Nutritional Needs:   Kcal:  1700-2000 kcals  Protein:  85-100 g  Fluid:  >/= 2 L  EDUCATION NEEDS:   No education needs identified at this time  Romelle StarcherCate Johanan Skorupski MS, RD, LDN [phone] Pager  [phone] Weekend/On-Call Pager

## 2016-02-29 LAB — GLUCOSE, CAPILLARY
GLUCOSE-CAPILLARY: 144 mg/dL — AB (ref 65–99)
Glucose-Capillary: 120 mg/dL — ABNORMAL HIGH (ref 65–99)
Glucose-Capillary: 134 mg/dL — ABNORMAL HIGH (ref 65–99)
Glucose-Capillary: 140 mg/dL — ABNORMAL HIGH (ref 65–99)
Glucose-Capillary: 145 mg/dL — ABNORMAL HIGH (ref 65–99)
Glucose-Capillary: 159 mg/dL — ABNORMAL HIGH (ref 65–99)

## 2016-02-29 LAB — CBC WITH DIFFERENTIAL/PLATELET
Basophils Absolute: 0 K/uL (ref 0–0.1)
Basophils Relative: 0 %
Eosinophils Absolute: 0.2 K/uL (ref 0–0.7)
Eosinophils Relative: 2 %
HCT: 36.7 % (ref 35.0–47.0)
Hemoglobin: 12.4 g/dL (ref 12.0–16.0)
Lymphocytes Relative: 16 %
Lymphs Abs: 1.3 K/uL (ref 1.0–3.6)
MCH: 30.3 pg (ref 26.0–34.0)
MCHC: 33.9 g/dL (ref 32.0–36.0)
MCV: 89.4 fL (ref 80.0–100.0)
Monocytes Absolute: 0.9 K/uL (ref 0.2–0.9)
Monocytes Relative: 12 %
Neutro Abs: 5.6 K/uL (ref 1.4–6.5)
Neutrophils Relative %: 70 %
Platelets: 175 K/uL (ref 150–440)
RBC: 4.11 MIL/uL (ref 3.80–5.20)
RDW: 14.2 % (ref 11.5–14.5)
WBC: 8 K/uL (ref 3.6–11.0)

## 2016-02-29 LAB — BASIC METABOLIC PANEL WITH GFR
Anion gap: 8 (ref 5–15)
BUN: 17 mg/dL (ref 6–20)
CO2: 25 mmol/L (ref 22–32)
Calcium: 8.2 mg/dL — ABNORMAL LOW (ref 8.9–10.3)
Chloride: 105 mmol/L (ref 101–111)
Creatinine, Ser: 0.74 mg/dL (ref 0.44–1.00)
GFR calc Af Amer: >60 mL/min
GFR calc non Af Amer: >60 mL/min
Glucose, Bld: 152 mg/dL — ABNORMAL HIGH (ref 65–99)
Potassium: 3.7 mmol/L (ref 3.5–5.1)
Sodium: 138 mmol/L (ref 135–145)

## 2016-02-29 LAB — PHOSPHORUS: PHOSPHORUS: 2.7 mg/dL (ref 2.5–4.6)

## 2016-02-29 MED ORDER — MORPHINE SULFATE (PF) 4 MG/ML IV SOLN: 2.0000 mg | INTRAVENOUS | Status: DC | PRN

## 2016-02-29 MED ORDER — ALUM & MAG HYDROXIDE-SIMETH 200-200-20 MG/5ML PO SUSP
15.0000 mL | ORAL | Status: DC | PRN
  Administered 2016-02-29: 15 mL via ORAL
  Filled 2016-02-29: qty 30

## 2016-02-29 MED ORDER — ACETAMINOPHEN 325 MG PO TABS
650.0000 mg | ORAL_TABLET | Freq: Four times a day (QID) | ORAL | Status: DC | PRN
  Administered 2016-02-29: 650 mg via ORAL
  Filled 2016-02-29: qty 2

## 2016-02-29 MED ORDER — ONDANSETRON HCL 4 MG/2ML IJ SOLN
4.0000 mg | INTRAMUSCULAR | Status: DC | PRN
  Administered 2016-02-29 – 2016-03-03 (×6): 4 mg via INTRAVENOUS
  Filled 2016-02-29 (×6): qty 2

## 2016-02-29 MED ORDER — HYDROCODONE-ACETAMINOPHEN 5-325 MG PO TABS: 1.0000 | ORAL_TABLET | ORAL | Status: DC | PRN

## 2016-02-29 NOTE — Progress Notes (Signed)
Called Dr. Orvis BrillLoflin regarding nausea medication.  Appropriate orders were placed.  Arturo MortonClay, Jeremy Ditullio N  02/29/2016  8:30 PM

## 2016-02-29 NOTE — Plan of Care (Signed)
Problem: Activity: Goal: Risk for activity intolerance will decrease Outcome: Progressing Patient has been up to the chair.  Problem: Fluid Volume: Goal: Ability to maintain a balanced intake and output will improve Outcome: Progressing Patient is on clear liquid diet and still receiving continuous tube feedings.  Problem: Bowel/Gastric: Goal: Will not experience complications related to bowel motility Outcome: Progressing Patient is now passing flatus.

## 2016-02-29 NOTE — Progress Notes (Signed)
5 Days Post-Op  Subjective: Patient feels a little better every day. She tolerated a clear liquid diet and has ordered a low sugar clear liquid for this morning. She has no nausea vomiting no fevers or chills  Objective: Vital signs in last 24 hours: Temp:  [98.2 F (36.8 C)-98.9 F (37.2 C)] 98.9 F (37.2 C) (11/21 0542) Pulse Rate:  [71-77] 71 (11/21 0542) Resp:  [22-40] 22 (11/21 0542) BP: (127-144)/(62-68) 127/62 (11/21 0542) SpO2:  [93 %-96 %] 94 % (11/21 0542) Weight:  [177 lb 14.4 oz (80.7 kg)] 177 lb 14.4 oz (80.7 kg) (11/21 0410) Last BM Date: 02/23/16  Intake/Output from previous day: 11/20 0701 - 11/21 0700 In: 1990 [P.O.:180; I.V.:658; NG/GT:1022; IV Piggyback:100] Out: 2485 [Urine:2450; Drains:35] Intake/Output this shift: No intake/output data recorded.  Physical exam:  Awake alert and oriented vital signs are reviewed abdomen is soft wound is clean drains are serosanguineous only calves are nontender  Lab Results: CBC   Recent Labs  02/29/16 0442  WBC 8.0  HGB 12.4  HCT 36.7  PLT 175   BMET  Recent Labs  02/28/16 0510 02/29/16 0442  NA 137 138  K 3.7 3.7  CL 105 105  CO2 27 25  GLUCOSE 144* 152*  BUN 15 17  CREATININE 0.61 0.74  CALCIUM 7.9* 8.2*   PT/INR No results for input(s): LABPROT, INR in the last 72 hours. ABG No results for input(s): PHART, HCO3 in the last 72 hours.  Invalid input(s): PCO2, PO2  Studies/Results: No results found.  Anti-infectives: Anti-infectives    Start     Dose/Rate Route Frequency Ordered Stop   02/25/16 0600  piperacillin-tazobactam (ZOSYN) IVPB 3.375 g     3.375 g 12.5 mL/hr over 240 Minutes Intravenous Every 8 hours 02/25/16 0023     02/24/16 2036  piperacillin-tazobactam (ZOSYN) 3.375 GM/50ML IVPB  Status:  Discontinued    Comments:  NEAL, SUSAN: cabinet override      02/24/16 2036 02/24/16 2341      Assessment/Plan: s/p Procedure(s): EXPLORATORY LAPAROTOMY with repair of duodenal  perforation   Patient doing quite well will advance to full liquids today and start oral analgesics DC her Foley catheter today as well I will likely discontinue her PCA later today or tomorrow.  Lattie Hawichard E Cooper, MD, FACS  02/29/2016

## 2016-02-29 NOTE — Progress Notes (Signed)
Pt and husband in room. Pt was upbeat. 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 Pt prompted husband to tell several stories which she seemed to enjoy hearing. CH offered prayer.   02/29/16 1115  Clinical Encounter Type  Visited With Patient and family together  Visit Type Follow-up  Referral From Chaplain  Spiritual Encounters  Spiritual Needs Prayer;Emotional  Stress Factors  Patient Stress Factors None identified  Family Stress Factors None identified

## 2016-02-29 NOTE — Evaluation (Signed)
Physical Therapy Evaluation Patient Details Name: Alice HaggisJanice Holm Bond MRN: [id number] DOB: October 07, 1929 Today's Date: 02/29/2016   History of Present Illness  Pt is a 82 y/o F who presented to the ED due to onset of severe intermittent epigastric pain.  Pt is now s/p exploratory laparotomy with repair of duodenal perforation.  Pt's PMH includes neck surgery.    Clinical Impression  Patient is s/p above surgery resulting in functional limitations due to the deficits listed below (see PT Problem List). Pt presents with guarded kyphotic posture and was encouraged to demonstrate upright posture for gentle stretching while sitting in chair.  She is Independent using two canes at baseline to ambulate and her husband will be available to provide 24/7 assist/supervision at d/c.  She currently requires min assist with all aspects of mobility due to pain and instability.  Performed 5xSTS test: 42.87 seconds indicating pt is at a higher risk of falling and demonstrates LE weakness.  Patient will benefit from skilled PT to increase their independence and safety with mobility to allow discharge to the venue listed below.      Follow Up Recommendations Home health PT    Equipment Recommendations  Rolling walker with 5" wheels    Recommendations for Other Services       Precautions / Restrictions Precautions Precautions: Fall;Other (comment) Precaution Comments: J tube, two JP drains Restrictions Weight Bearing Restrictions: No      Mobility  Bed Mobility Overal bed mobility: Needs Assistance Bed Mobility: Rolling;Sidelying to Sit Rolling: Min guard Sidelying to sit: HOB elevated;Min assist       General bed mobility comments: Pt requires increased time and effort with assist provided to elevate trunk.    Transfers Overall transfer level: Needs assistance Equipment used: 1 person hand held assist Transfers: Sit to/from [employer] CorporationStand;Stand Pivot Transfers Sit to Stand: Min assist Stand pivot  transfers: Min assist       General transfer comment: 1 person HHA to steady for sit<>stand and stand pivot with pt anxiously reaching for armrests for support.  Pt with definite use of BUEs for controlled descent to chair and BSC.  Ambulation/Gait Ambulation/Gait assistance: Min assist Ambulation Distance (Feet): 15 Feet Assistive device: 1 person hand held assist Gait Pattern/deviations: Step-through pattern;Decreased stride length;Trunk flexed Gait velocity: decreased Gait velocity interpretation: Below normal speed for age/gender General Gait Details: Flexed posture and 1 person HHA to steady while ambulating in room.  Pt instructed not to hold onto IV pole for support.   Stairs            Wheelchair Mobility    Modified Rankin (Stroke Patients Only)       Balance Overall balance assessment: Needs assistance                                           Pertinent Vitals/Pain Pain Assessment: Faces Faces Pain Scale: Hurts a little bit Pain Location: abdomen Pain Descriptors / Indicators: Aching Pain Intervention(s): Limited activity within patient's tolerance;Monitored during session;Repositioned    Home Living Family/patient expects to be discharged to:: Private residence Living Arrangements: Spouse/significant other Available Help at Discharge: Family;Available 24 hours/day Type of Home: House Home Access: Ramped entrance     Home Layout: One level Home Equipment: Cane - single point      Prior Function Level of Independence: Independent with assistive device(s)  Comments: Limited ambulatory distance using SPCs in both hands due to arthritis.  Denies any falls in the past 2 years.  Per pt she has worked with PT in the past for her foot drop but unable to recall which side this was.  Independent with dressing, bathing, cooking, cleaning.     Hand Dominance        Extremity/Trunk Assessment   Upper Extremity Assessment:  Overall WFL for tasks assessed           Lower Extremity Assessment: LLE deficits/detail;RLE deficits/detail RLE Deficits / Details: DF 3-/5 LLE Deficits / Details: DF 3-/5  Cervical / Trunk Assessment: Kyphotic  Communication   Communication: No difficulties  Cognition Arousal/Alertness: Awake/alert Behavior During Therapy: WFL for tasks assessed/performed Overall Cognitive Status: Within Functional Limits for tasks assessed                      General Comments General comments (skin integrity, edema, etc.): Performed 5xSTS test: 42.87 seconds    Exercises Other Exercises Other Exercises: Performed sit<>stand x5 for 5xSTS test, pt with definite use of BUEs for controlled descent and to steady to rise to standing Other Exercises: Pt encouraged to sit in chair for at least 1 hr and to practice maintaining upright posture for gentle stretch   Assessment/Plan    PT Assessment Patient needs continued PT services  PT Problem List Decreased strength;Decreased activity tolerance;Decreased balance;Decreased mobility;Decreased knowledge of use of DME;Decreased safety awareness;Decreased knowledge of precautions;Pain          PT Treatment Interventions DME instruction;Gait training;Functional mobility training;Therapeutic activities;Therapeutic exercise;Balance training;Patient/family education;Modalities    PT Goals (Current goals can be found in the Care Plan section)  Acute Rehab PT Goals Patient Stated Goal: to get stronger with PT PT Goal Formulation: With patient Time For Goal Achievement: 03/14/16 Potential to Achieve Goals: Good    Frequency Min 2X/week   Barriers to discharge        Co-evaluation               End of Session Equipment Utilized During Treatment: Gait belt Activity Tolerance: Patient tolerated treatment well;Patient limited by fatigue Patient left: in chair;with call bell/phone within reach;with chair alarm set Nurse Communication:  Mobility status         Time: 5284-13241516-1545 PT Time Calculation (min) (ACUTE ONLY): 29 min   Charges:   PT Evaluation $PT Eval Low Complexity: 1 Procedure PT Treatments $Therapeutic Activity: 8-22 mins   PT G Codes:        Encarnacion ChuAshley Janmarie Smoot PT, DPT 02/29/2016, 4:15 PM

## 2016-02-29 NOTE — Progress Notes (Signed)
Pt seen and examined s/p repair of perf posterior duodenal ulcer Taking fulls, passing gas. On TF JP serous drainage. No emesis  PENAD  Abd: soft, drains in place, staples in place. No infection, no peritonitis  A/P Doing well Slowly advance diet and wean off TF May need to start empiric H pylori rx Path neg for malignancy PT

## 2016-03-01 ENCOUNTER — Inpatient Hospital Stay: Payer: Medicare Other

## 2016-03-01 LAB — GLUCOSE, CAPILLARY
GLUCOSE-CAPILLARY: 131 mg/dL — AB (ref 65–99)
GLUCOSE-CAPILLARY: 157 mg/dL — AB (ref 65–99)
Glucose-Capillary: 129 mg/dL — ABNORMAL HIGH (ref 65–99)
Glucose-Capillary: 125 mg/dL — ABNORMAL HIGH (ref 65–99)
Glucose-Capillary: 147 mg/dL — ABNORMAL HIGH (ref 65–99)
Glucose-Capillary: 135 mg/dL — ABNORMAL HIGH (ref 65–99)

## 2016-03-01 MED ORDER — IOPAMIDOL (ISOVUE-300) INJECTION 61%
150.0000 mL | Freq: Once | INTRAVENOUS | Status: AC | PRN
  Administered 2016-03-01: 150 mL via ORAL

## 2016-03-01 MED ORDER — SODIUM CHLORIDE 0.9 % IV SOLN
INTRAVENOUS | Status: DC
  Administered 2016-03-01 – 2016-03-03 (×5): via INTRAVENOUS
  Administered 2016-03-03: 100 mL/h via INTRAVENOUS
  Administered 2016-03-04: 05:00:00 via INTRAVENOUS

## 2016-03-01 MED ORDER — LACTATED RINGERS IV SOLN
INTRAVENOUS | Status: DC
  Administered 2016-03-01: 10:00:00 via INTRAVENOUS

## 2016-03-01 NOTE — Progress Notes (Signed)
Upper GI personally reviewed there is no sign of mechanical pyloric obstruction. There is flow into the duodenum and no sign of leak.  Patient is vomited again today fairly high volume. We'll make her nothing by mouth except ice chips for now. I cannot place an NG tube at this point due to the risk of disruption of a possibly tenuous suture line. As discussed with she and her husband

## 2016-03-01 NOTE — Care Management (Signed)
PT has assessed patient and recommend home health PT. Patient states that she has a cane in the home for ambulation. Pharmacy [facility] [place].  Patient currently on clear liquid diet.  Provided patient and husband with home health agency preference list to review. RNCM following

## 2016-03-01 NOTE — Plan of Care (Signed)
Problem: Fluid Volume: Goal: Ability to maintain a balanced intake and output will improve Outcome: Not Progressing Patient is on full liquids but has nausea/vomiting whenever she gets up to bedside commode.  She also has a feeding tube.  Problem: Nutrition: Goal: Adequate nutrition will be maintained Outcome: Not Progressing Patient has nausea/vomiting.  Problem: Bowel/Gastric: Goal: Will not experience complications related to bowel motility Outcome: Progressing Patient is passing gas and had a bowel movement.

## 2016-03-01 NOTE — Care Management Important Message (Signed)
Important Message  Patient Details  Name: Alice HaggisJanice Holm Bond MRN: [id number] Date of Birth: [birth year]   Medicare Important Message Given:  Yes    Chapman FitchBOWEN, Ladonna Vanorder T, RN 03/01/2016, 2:10 PM

## 2016-03-01 NOTE — Progress Notes (Signed)
Nutrition Follow-up  DOCUMENTATION CODES:   Not applicable  INTERVENTION:  -Recommend continuing current TF regimen at goal rate unable pt able to tolerate solid food -Diet advancement as able  NUTRITION DIAGNOSIS:   Inadequate oral intake related to acute illness as evidenced by NPO status.  Being addressed via TF  GOAL:   Patient will meet greater than or equal to 90% of their needs  MONITOR:   TF tolerance, Labs, Weight trends, I & O's  REASON FOR ASSESSMENT:   Consult Enteral/tube feeding initiation and management  ASSESSMENT:   82 yo female admitted with perforated duodenal ulcer s/p repair and placement of J-tube for nutrition on 11/16  Tolerating Vital 1.2 at rate of 65 ml/hr. Diet advanced to CL on 11/20 and pt tolerated. Advanced to FL on 11/21 but downgraded back to CL after pt with N/V. +flatus, +BM. Upper GI study this AM, possible gastric ileus Labs: reviewed Meds: LR at 100 ml/hr  Diet Order:  Diet clear liquid Room service appropriate? Yes; Fluid consistency: Thin  Skin:  Reviewed, no issues  Last BM:  11/21  Height:   Ht Readings from Last 1 Encounters:  02/25/16 5\' 7"  (1.702 m)    Weight:   Wt Readings from Last 1 Encounters:  03/01/16 169 lb 4.8 oz (76.8 kg)    Filed Weights   02/28/16 0300 02/29/16 0410 03/01/16 0454  Weight: 180 lb 12.8 oz (82 kg) 177 lb 14.4 oz (80.7 kg) 169 lb 4.8 oz (76.8 kg)    BMI:  Body mass index is 26.52 kg/m.  Estimated Nutritional Needs:   Kcal:  1700-2000 kcals  Protein:  85-100 g  Fluid:  >/= 2 L  EDUCATION NEEDS:   No education needs identified at this time  Romelle StarcherCate Leonetta Mcgivern MS, RD, LDN [phone] Pager  [phone] Weekend/On-Call Pager

## 2016-03-02 LAB — CBC WITH DIFFERENTIAL/PLATELET
Basophils Absolute: 0.1 10*3/uL (ref 0–0.1)
Basophils Relative: 1 %
EOS ABS: 0.2 10*3/uL (ref 0–0.7)
Eosinophils Relative: 3 %
HEMATOCRIT: 36.5 % (ref 35.0–47.0)
HEMOGLOBIN: 12 g/dL (ref 12.0–16.0)
LYMPHS ABS: 1.9 10*3/uL (ref 1.0–3.6)
LYMPHS PCT: 20 %
MCH: 29.6 pg (ref 26.0–34.0)
MCHC: 32.8 g/dL (ref 32.0–36.0)
MCV: 90.3 fL (ref 80.0–100.0)
MONOS PCT: 13 %
Monocytes Absolute: 1.2 10*3/uL — ABNORMAL HIGH (ref 0.2–0.9)
NEUTROS PCT: 63 %
Neutro Abs: 6.1 10*3/uL (ref 1.4–6.5)
Platelets: 254 10*3/uL (ref 150–440)
RBC: 4.04 MIL/uL (ref 3.80–5.20)
RDW: 14.2 % (ref 11.5–14.5)
WBC: 9.5 10*3/uL (ref 3.6–11.0)

## 2016-03-02 LAB — COMPREHENSIVE METABOLIC PANEL
ALK PHOS: 113 U/L (ref 38–126)
ALT: 53 U/L (ref 14–54)
ANION GAP: 5 (ref 5–15)
AST: 34 U/L (ref 15–41)
Albumin: 2.2 g/dL — ABNORMAL LOW (ref 3.5–5.0)
BILIRUBIN TOTAL: 0.4 mg/dL (ref 0.3–1.2)
BUN: 24 mg/dL — ABNORMAL HIGH (ref 6–20)
CALCIUM: 8.3 mg/dL — AB (ref 8.9–10.3)
CO2: 30 mmol/L (ref 22–32)
CREATININE: 0.74 mg/dL (ref 0.44–1.00)
Chloride: 106 mmol/L (ref 101–111)
Glucose, Bld: 110 mg/dL — ABNORMAL HIGH (ref 65–99)
Potassium: 4.3 mmol/L (ref 3.5–5.1)
SODIUM: 141 mmol/L (ref 135–145)
TOTAL PROTEIN: 5.9 g/dL — AB (ref 6.5–8.1)

## 2016-03-02 LAB — GLUCOSE, CAPILLARY
GLUCOSE-CAPILLARY: 100 mg/dL — AB (ref 65–99)
GLUCOSE-CAPILLARY: 111 mg/dL — AB (ref 65–99)
GLUCOSE-CAPILLARY: 116 mg/dL — AB (ref 65–99)
GLUCOSE-CAPILLARY: 135 mg/dL — AB (ref 65–99)
GLUCOSE-CAPILLARY: 91 mg/dL (ref 65–99)
Glucose-Capillary: 100 mg/dL — ABNORMAL HIGH (ref 65–99)

## 2016-03-02 MED ORDER — VITAL AF 1.2 CAL PO LIQD
1000.0000 mL | ORAL | Status: DC
  Administered 2016-03-02 – 2016-03-03 (×2): 1000 mL

## 2016-03-02 MED ORDER — PROMETHAZINE HCL 25 MG RE SUPP
12.5000 mg | Freq: Two times a day (BID) | RECTAL | Status: DC
Start: 2016-03-02 — End: 2016-03-05
  Administered 2016-03-02 – 2016-03-04 (×3): 12.5 mg via RECTAL
  Filled 2016-03-02 (×3): qty 1

## 2016-03-02 NOTE — Progress Notes (Signed)
7 Days Post-Op  Subjective: Patient vomited again last night approximately 1 AM large volume. She does not have nausea as a precursor of the just all of a sudden causes her to vomit. As no warning. She is on tube feeds at 65 cc/h and tolerating that quite well passing gas and having bowel movements. She has no pain today  Objective: Vital signs in last 24 hours: Temp:  [98 F (36.7 C)-99.2 F (37.3 C)] 99.2 F (37.3 C) (11/23 0806) Pulse Rate:  [65-75] 65 (11/23 0806) Resp:  [17-20] 20 (11/23 0806) BP: (122-134)/(61-76) 124/61 (11/23 0806) SpO2:  [92 %-96 %] 94 % (11/23 0806) Weight:  [169 lb 8 oz (76.9 kg)] 169 lb 8 oz (76.9 kg) (11/23 0500) Last BM Date: 02/29/16  Intake/Output from previous day: 11/22 0701 - 11/23 0700 In: 4707.2 [I.V.:2345.2; NG/GT:1014; IV Piggyback:116] Out: 835 [Urine:325; Emesis/NG output:500; Drains:10] Intake/Output this shift: No intake/output data recorded.  Physical exam:  Awake alert and oriented abdomen is soft nondistended nontympanitic and nontender wound is clean injured drains show sero-sanguinous fluid only. J tube is functional. Calves are nontender  Lab Results: CBC   Recent Labs  02/29/16 0442 03/02/16 0457  WBC 8.0 9.5  HGB 12.4 12.0  HCT 36.7 36.5  PLT 175 254   BMET  Recent Labs  02/29/16 0442 03/02/16 0457  NA 138 141  K 3.7 4.3  CL 105 106  CO2 25 30  GLUCOSE 152* 110*  BUN 17 24*  CREATININE 0.74 0.74  CALCIUM 8.2* 8.3*   PT/INR No results for input(s): LABPROT, INR in the last 72 hours. ABG No results for input(s): PHART, HCO3 in the last 72 hours.  Invalid input(s): PCO2, PO2  Studies/Results: Dg Ugi W/water Sol Cm  Result Date: 03/01/2016 CLINICAL DATA:  Status post Alice Bond patch for perforated ulcer. Vomiting now. Evaluate for obstruction. EXAM: WATER SOLUBLE UPPER GI SERIES TECHNIQUE: Single-column upper GI series was performed using water soluble contrast. COMPARISON:  None. FLUOROSCOPY TIME:   Fluoroscopy Time:  1.4 minute Radiation Exposure Index (if provided by the fluoroscopic device): 131.5 mGy Number of Acquired Spot Images: 12 FINDINGS: Patient was administered 50 mL Isovue-300 orally. There a large amount of secretions in the stomach. The stomach is distended. Contrast opacifies the stomach with emptying into the duodenum. In the no extraluminal contrast to suggest perforation. IMPRESSION: 1. No extraluminal contrast to suggest duodenal leak status post Alice Bond patch. 2. Large amount of secretions in the stomach with distension may reflect an ileus. No duodenal narrowing to suggest obstruction. Electronically Signed   By: Elige KoHetal  Patel   On: 03/01/2016 11:20    Anti-infectives: Anti-infectives    Start     Dose/Rate Route Frequency Ordered Stop   02/25/16 0600  piperacillin-tazobactam (ZOSYN) IVPB 3.375 g     3.375 g 12.5 mL/hr over 240 Minutes Intravenous Every 8 hours 02/25/16 0023     02/24/16 2036  piperacillin-tazobactam (ZOSYN) 3.375 GM/50ML IVPB  Status:  Discontinued    Comments:  NEAL, SUSAN: cabinet override      02/24/16 2036 02/24/16 2341      Assessment/Plan: s/p Procedure(s): EXPLORATORY LAPAROTOMY with repair of duodenal perforation   Gastrografin study yesterday failed to identify any sort of leak or mechanical obstruction. She is passing gas and having bowel movements but continues to vomit large volumes approximately twice a day. I will decrease the volume of her J-tube feedings as this may possibly contribute to some of this although she's not vomiting tube  feeds it could slow down her transit time etc. making her stomach not empty as quickly as expected. I do not know the real etiology of her voluminous twice a day vomiting but I will also attempt some Phenergan suppositories to see if this helps at all as well. Continue ice chips only for now  Alice Hawichard E Avner Stroder, MD, FACS  03/02/2016

## 2016-03-03 LAB — CBC WITH DIFFERENTIAL/PLATELET
BASOS PCT: 1 %
Basophils Absolute: 0.1 10*3/uL (ref 0–0.1)
EOS ABS: 0.1 10*3/uL (ref 0–0.7)
Eosinophils Relative: 1 %
HEMATOCRIT: 37 % (ref 35.0–47.0)
Hemoglobin: 12.3 g/dL (ref 12.0–16.0)
Lymphocytes Relative: 6 %
Lymphs Abs: 0.6 10*3/uL — ABNORMAL LOW (ref 1.0–3.6)
MCH: 29.5 pg (ref 26.0–34.0)
MCHC: 33.3 g/dL (ref 32.0–36.0)
MCV: 88.8 fL (ref 80.0–100.0)
MONO ABS: 0.5 10*3/uL (ref 0.2–0.9)
MONOS PCT: 5 %
NEUTROS ABS: 9.5 10*3/uL — AB (ref 1.4–6.5)
Neutrophils Relative %: 87 %
Platelets: 241 10*3/uL (ref 150–440)
RBC: 4.16 MIL/uL (ref 3.80–5.20)
RDW: 14.5 % (ref 11.5–14.5)
WBC: 10.8 10*3/uL (ref 3.6–11.0)

## 2016-03-03 LAB — GLUCOSE, CAPILLARY
GLUCOSE-CAPILLARY: 109 mg/dL — AB (ref 65–99)
GLUCOSE-CAPILLARY: 119 mg/dL — AB (ref 65–99)
GLUCOSE-CAPILLARY: 121 mg/dL — AB (ref 65–99)
GLUCOSE-CAPILLARY: 134 mg/dL — AB (ref 65–99)
Glucose-Capillary: 112 mg/dL — ABNORMAL HIGH (ref 65–99)
Glucose-Capillary: 120 mg/dL — ABNORMAL HIGH (ref 65–99)
Glucose-Capillary: 124 mg/dL — ABNORMAL HIGH (ref 65–99)

## 2016-03-03 LAB — BASIC METABOLIC PANEL
Anion gap: 7 (ref 5–15)
BUN: 20 mg/dL (ref 6–20)
CALCIUM: 8.5 mg/dL — AB (ref 8.9–10.3)
CO2: 26 mmol/L (ref 22–32)
CREATININE: 0.76 mg/dL (ref 0.44–1.00)
Chloride: 108 mmol/L (ref 101–111)
GFR calc non Af Amer: >60 mL/min (ref >60)
Glucose, Bld: 123 mg/dL — ABNORMAL HIGH (ref 65–99)
Potassium: 3.6 mmol/L (ref 3.5–5.1)
Sodium: 141 mmol/L (ref 135–145)

## 2016-03-03 NOTE — Care Management Important Message (Signed)
Important Message  Patient Details  Name: Alice Bond MRN: [id number] Date of Birth: February 06, 1930   Medicare Important Message Given:  Yes    Bobby Barton A, RN 03/03/2016, 7:49 AM

## 2016-03-03 NOTE — Progress Notes (Signed)
8 Days Post-Op  Subjective: Patient with some mild nausea but no further emesis she is tolerating tube feeds at half the rate and having multiple bowel movements. Abdominal pain.  Objective: Vital signs in last 24 hours: Temp:  [98.1 F (36.7 C)-99.3 F (37.4 C)] 98.1 F (36.7 C) (11/24 0440) Pulse Rate:  [65-77] 77 (11/24 0440) Resp:  [6-24] 16 (11/24 0440) BP: (107-139)/(60-69) 110/67 (11/24 0440) SpO2:  [93 %-97 %] 97 % (11/24 0440) Weight:  [174 lb 4.8 oz (79.1 kg)] 174 lb 4.8 oz (79.1 kg) (11/24 0618) Last BM Date: 03/02/16  Intake/Output from previous day: 11/23 0701 - 11/24 0700 In: 3194.9 [I.V.:2810.8; NG/GT:256; IV Piggyback:128.1] Out: 415 [Urine:400; Drains:15] Intake/Output this shift: No intake/output data recorded.  Physical exam:  Awake alert and oriented Signs stable and reviewed Wounds are clean no purulence in drains. Nontender calves.  Lab Results: CBC   Recent Labs  03/02/16 0457 03/03/16 0533  WBC 9.5 10.8  HGB 12.0 12.3  HCT 36.5 37.0  PLT 254 241   BMET  Recent Labs  03/02/16 0457 03/03/16 0533  NA 141 141  K 4.3 3.6  CL 106 108  CO2 30 26  GLUCOSE 110* 123*  BUN 24* 20  CREATININE 0.74 0.76  CALCIUM 8.3* 8.5*   PT/INR No results for input(s): LABPROT, INR in the last 72 hours. ABG No results for input(s): PHART, HCO3 in the last 72 hours.  Invalid input(s): PCO2, PO2  Studies/Results: Dg Ugi W/water Sol Cm  Result Date: 03/01/2016 CLINICAL DATA:  Status post Cheree DittoGraham patch for perforated ulcer. Vomiting now. Evaluate for obstruction. EXAM: WATER SOLUBLE UPPER GI SERIES TECHNIQUE: Single-column upper GI series was performed using water soluble contrast. COMPARISON:  None. FLUOROSCOPY TIME:  Fluoroscopy Time:  1.4 minute Radiation Exposure Index (if provided by the fluoroscopic device): 131.5 mGy Number of Acquired Spot Images: 12 FINDINGS: Patient was administered 50 mL Isovue-300 orally. There a large amount of secretions in  the stomach. The stomach is distended. Contrast opacifies the stomach with emptying into the duodenum. In the no extraluminal contrast to suggest perforation. IMPRESSION: 1. No extraluminal contrast to suggest duodenal leak status post Cheree DittoGraham patch. 2. Large amount of secretions in the stomach with distension may reflect an ileus. No duodenal narrowing to suggest obstruction. Electronically Signed   By: Elige KoHetal  Patel   On: 03/01/2016 11:20    Anti-infectives: Anti-infectives    Start     Dose/Rate Route Frequency Ordered Stop   02/25/16 0600  piperacillin-tazobactam (ZOSYN) IVPB 3.375 g     3.375 g 12.5 mL/hr over 240 Minutes Intravenous Every 8 hours 02/25/16 0023     02/24/16 2036  piperacillin-tazobactam (ZOSYN) 3.375 GM/50ML IVPB  Status:  Discontinued    Comments:  NEAL, SUSAN: cabinet override      02/24/16 2036 02/24/16 2341      Assessment/Plan: s/p Procedure(s): EXPLORATORY LAPAROTOMY with repair of duodenal perforation   Patient doing quite well has stopped of her vomiting but still has some nausea and is not ready to advance diet yet. As she is 8 days postop I will stop her IV antibiotics at this point and consider removing her drains as well but will likely leave the drains in until diet is advanced.  Lattie Hawichard E Paulmichael Schreck, MD, FACS  03/03/2016

## 2016-03-03 NOTE — Progress Notes (Signed)
Pharmacy Antibiotic Note  Alice Bond is a 82 y.o. female admitted on 02/24/2016 with duodenal perforation.  Pharmacy  consulted for zosyn dosing.  Plan: Continue Zosyn 3.375 grams q 8 hours as ordered.  Height: 5\' 7"  (170.2 cm) Weight: 174 lb 4.8 oz (79.1 kg) IBW/kg (Calculated) : 61.6  Temp (24hrs), Avg:98.6 F (37 C), Min:98.1 F (36.7 C), Max:99.3 F (37.4 C)   Recent Labs Lab 02/26/16 0517 02/28/16 0510 02/29/16 0442 03/02/16 0457 03/03/16 0533  WBC 9.4  --  8.0 9.5 10.8  CREATININE 0.85 0.61 0.74 0.74 0.76    Estimated Creatinine Clearance: 56.7 mL/min (by C-G formula based on SCr of 0.76 mg/dL).    No Known Allergies  Antimicrobials this admission: Cefazolin x1  >>  Zosyn 11/17  >>   Dose adjustments this admission:   Microbiology results:   Thank you for allowing pharmacy to be a part of this patient's care.  Carlyle Achenbach D 03/03/2016 8:05 AM

## 2016-03-03 NOTE — Plan of Care (Signed)
Problem: Pain Managment: Goal: General experience of comfort will improve Outcome: Progressing No pain meds given today  Problem: Activity: Goal: Risk for activity intolerance will decrease Outcome: Progressing Ambulated around unit. Sitting up in chair  Problem: Nutrition: Goal: Adequate nutrition will be maintained Outcome: Progressing Advancing diet. Tolerating without n/v

## 2016-03-03 NOTE — Progress Notes (Signed)
Seen and examined Doing well Tolerated PO and TF AVSS  PE NAD Abd: soft, staples in place no infection JP serous  A/P doing well DC in am hopefully Will need H pylori rx empirically May probably DC staples before DC F/U w me in 10 days

## 2016-03-03 NOTE — Progress Notes (Signed)
Physical Therapy Treatment Patient Details Name: Alice Bond MRN: [name] DOB: 07-06-1929 Today's Date: 03/03/2016    History of Present Illness Pt is a 82 y/o F who presented to the ED due to onset of severe intermittent epigastric pain.  Pt is now s/p exploratory laparotomy with repair of duodenal perforation.  Pt's PMH includes neck surgery.    PT Comments    Pt is getting  Up to walk with PT and has significant issue of weakness in LE's despite her control of walking.  Will continue acutely as she is still higher fall risk and will expect a safe transition to HHPT for same.  Her treatment involves care of lines and will need to decide how to handle the IV pole and need to determine if an AD is going to substitute.  Follow Up Recommendations  Home health PT     Equipment Recommendations  Rolling walker with 5" wheels    Recommendations for Other Services       Precautions / Restrictions Precautions Precautions: Fall (IV and J tube line) Precaution Comments: J tube, two JP drains Restrictions Weight Bearing Restrictions: No    Mobility  Bed Mobility               General bed mobility comments: up when PT entered  Transfers Overall transfer level: Needs assistance Equipment used: 1 person hand held assist Transfers: Sit to/from [employer] CorporationStand;Stand Pivot Transfers Sit to Stand: Min guard;From elevated surface (with SPC and HHA) Stand pivot transfers: Min guard;From elevated surface (cued and SPC with HHA)          Ambulation/Gait Ambulation/Gait assistance: Min assist;Min guard Ambulation Distance (Feet): 300 Feet Assistive device: Straight cane;1 person hand held assist Gait Pattern/deviations: Step-through pattern;Wide base of support;Decreased stride length;Trunk flexed Gait velocity: decreased Gait velocity interpretation: Below normal speed for age/gender General Gait Details: allowed pt to use IV pole    Stairs            Wheelchair  Mobility    Modified Rankin (Stroke Patients Only)       Balance Overall balance assessment: Needs assistance Sitting-balance support: Feet supported Sitting balance-Leahy Scale: Good                              Cognition Arousal/Alertness: Awake/alert Behavior During Therapy: WFL for tasks assessed/performed Overall Cognitive Status: Within Functional Limits for tasks assessed                      Exercises      General Comments        Pertinent Vitals/Pain Pain Assessment: Faces Pain Score: 2  Faces Pain Scale: Hurts a little bit Pain Location: abdomen Pain Descriptors / Indicators: Aching Pain Intervention(s): Monitored during session;Repositioned    Home Living                      Prior Function            PT Goals (current goals can now be found in the care plan section) Acute Rehab PT Goals Patient Stated Goal: to get stronger with PT Progress towards PT goals: Progressing toward goals    Frequency    Min 2X/week      PT Plan Current plan remains appropriate    Co-evaluation             End of Session Equipment Utilized During Treatment: Gait  belt Activity Tolerance: Patient tolerated treatment well;Patient limited by fatigue Patient left: in chair;with call bell/phone within reach;with chair alarm set     Time: [id number] PT Time Calculation (min) (ACUTE ONLY): 29 min  Charges:  $Gait Training: 8-22 mins $Therapeutic Exercise: 8-22 mins                    G Codes:      Ivar DrapeStout, Darrik Richman E 03/03/2016, 4:56 PM    Samul Dadauth Adiel Mcnamara, PT MS Acute Rehab Dept. Number: [phone] and [facility] [phone]

## 2016-03-04 LAB — GLUCOSE, CAPILLARY
GLUCOSE-CAPILLARY: 115 mg/dL — AB (ref 65–99)
GLUCOSE-CAPILLARY: 109 mg/dL — AB (ref 65–99)
GLUCOSE-CAPILLARY: 129 mg/dL — AB (ref 65–99)
Glucose-Capillary: 117 mg/dL — ABNORMAL HIGH (ref 65–99)
Glucose-Capillary: 142 mg/dL — ABNORMAL HIGH (ref 65–99)
Glucose-Capillary: 143 mg/dL — ABNORMAL HIGH (ref 65–99)

## 2016-03-04 NOTE — Progress Notes (Addendum)
Tube feeding empty. Upon entering room to hang new bottle of tube feed, patient requested that I contact Dr. Excell Seltzerooper to inquire about slowing or stopping tube feeds. Patient informed RN that 100% of tray had been eaten. Dr. Excell Seltzerooper notified and MD order to discontinue tube feedings. J tube flushed and clamped per policy.

## 2016-03-04 NOTE — Progress Notes (Signed)
9 Days Post-Op  Subjective: No further vomiting tolerating a full liquid diet and feeling better overall.  Objective: Vital signs in last 24 hours: Temp:  [98.6 F (37 C)-99.2 F (37.3 C)] 98.6 F (37 C) (11/25 0458) Pulse Rate:  [67-69] 67 (11/25 0458) Resp:  [18-20] 18 (11/25 0458) BP: (133-136)/(68-69) 133/69 (11/25 0458) SpO2:  [94 %-96 %] 96 % (11/25 0458) Weight:  [169 lb 3.2 oz (76.7 kg)] 169 lb 3.2 oz (76.7 kg) (11/25 0500) Last BM Date: 03/04/16  Intake/Output from previous day: 11/24 0701 - 11/25 0700 In: 5087.3 [P.O.:1825; I.V.:2333.3; NG/GT:749] Out: 2160.5 [Urine:2050; Drains:60.5; Stool:50] Intake/Output this shift: Total I/O In: -  Out: 450 [Urine:450]  Physical exam:  Low grade temp with a MAXIMUM TEMPERATURE of 99 to Abdomen is soft nondistended nontympanitic and nontender drains are serous only. Tube feeds at 35/h Nontender calves Wound is clean  Lab Results: CBC   Recent Labs  03/02/16 0457 03/03/16 0533  WBC 9.5 10.8  HGB 12.0 12.3  HCT 36.5 37.0  PLT 254 241   BMET  Recent Labs  03/02/16 0457 03/03/16 0533  NA 141 141  K 4.3 3.6  CL 106 108  CO2 30 26  GLUCOSE 110* 123*  BUN 24* 20  CREATININE 0.74 0.76  CALCIUM 8.3* 8.5*   PT/INR No results for input(s): LABPROT, INR in the last 72 hours. ABG No results for input(s): PHART, HCO3 in the last 72 hours.  Invalid input(s): PCO2, PO2  Studies/Results: No results found.  Anti-infectives: Anti-infectives    Start     Dose/Rate Route Frequency Ordered Stop   02/25/16 0600  piperacillin-tazobactam (ZOSYN) IVPB 3.375 g  Status:  Discontinued     3.375 g 12.5 mL/hr over 240 Minutes Intravenous Every 8 hours 02/25/16 0023 03/03/16 0858   02/24/16 2036  piperacillin-tazobactam (ZOSYN) 3.375 GM/50ML IVPB  Status:  Discontinued    Comments:  NEAL, SUSAN: cabinet override      02/24/16 2036 02/24/16 2341      Assessment/Plan: s/p Procedure(s): EXPLORATORY LAPAROTOMY with repair  of duodenal perforation   Will advance diet and if tolerates will DC tube feeds and probably be able to discharge tomorrow.  Alice Hawichard E Jamelle Goldston, MD, FACS  03/04/2016

## 2016-03-04 NOTE — Progress Notes (Signed)
Pt alert and sitting up in bed with husband in room. Pt was animated in conversation and is looking to be discharged tomorrow. CH offered prayer.   03/04/16 1015  Clinical Encounter Type  Visited With Patient and family together  Visit Type Follow-up  Referral From Chaplain  Spiritual Encounters  Spiritual Needs Prayer;Emotional  Stress Factors  Patient Stress Factors None identified  Family Stress Factors None identified

## 2016-03-05 LAB — GLUCOSE, CAPILLARY
GLUCOSE-CAPILLARY: 110 mg/dL — AB (ref 65–99)
Glucose-Capillary: 131 mg/dL — ABNORMAL HIGH (ref 65–99)

## 2016-03-05 MED ORDER — PANTOPRAZOLE SODIUM 40 MG PO TBEC: 40.0000 mg | DELAYED_RELEASE_TABLET | Freq: Every day | ORAL | 1 refills | Status: AC

## 2016-03-05 MED ORDER — SUCRALFATE 1 G PO TABS: 1.0000 g | ORAL_TABLET | Freq: Three times a day (TID) | ORAL | 1 refills | Status: DC

## 2016-03-05 MED ORDER — HYDROCODONE-ACETAMINOPHEN 5-325 MG PO TABS: 1.0000 | ORAL_TABLET | ORAL | 0 refills | Status: DC | PRN

## 2016-03-05 MED ORDER — PANTOPRAZOLE SODIUM 40 MG PO TBEC: 40.0000 mg | DELAYED_RELEASE_TABLET | Freq: Every day | ORAL | Status: DC

## 2016-03-05 NOTE — Discharge Summary (Signed)
Physician Discharge Summary  Patient ID: Phillis HaggisJanice Holm Wheeler MRN: [id number] DOB/AGE: February 22, 1930 82 y.o.  Admit date: 02/24/2016 Discharge date: 03/05/2016   Discharge Diagnoses:  Active Problems:   Perforated ulcer (HCC)   Procedures:Repair of perforated ulcer  Hospital Course: This patient presented to the emergency room with signs of an acute perforation of a duodenal ulcer. She was taken the operating room by Dr. Everlene FarrierPabon who performed repair of ulcer with Graham's patch. Stop and we she was doing well and nasogastric tube was removed but she vomited after that and a upper GI showed flow out of the stomach without mechanical obstruction and no edema limiting outflow. Ultimately she was advanced on her diet and is currently tolerating a soft diet with instructions to advance to a regular diet. Her antibiotic's of been stopped and her drains are been removed. She is sent home to resume her oral medications as well as added Protonix and Carafate. She will also be given Vicodin for pain. She will follow up with [facility] at the end of the week for staple removal. She has instructions to shower. Her J-tube, feeding tube, does not require any care with the exception of dressing changes as needed.  Consults: None  Disposition: Final discharge disposition not confirmed    Follow-up Information    Dionne Miloichard Rowen Wilmer, MD Follow up in 5 day(s).   Specialty:  Surgery Contact information: [address] [place] [address] [phone]           Lattie Hawichard E Danel Studzinski, MD, FACS

## 2016-03-05 NOTE — Care Management Note (Signed)
Case Management Note  Patient Details  Name: Alice Bond MRN: [id number] Date of Birth: 1929-10-09  Subjective/Objective:   Discussed discharge planning with Ms Alice Bond who chose [facility] to be her home health provider. A referral for home health PT, RN, Aide was [facility]ed to [facility]. Ms Alice Bond already has a walker at home.                  Action/Plan:   Expected Discharge Date:                  Expected Discharge Plan:     In-House Referral:     Discharge planning Services     Post Acute Care Choice:    Choice offered to:     DME Arranged:    DME Agency:     HH Arranged:    HH Agency:     Status of Service:     If discussed at [employer] Length of Stay Meetings, dates discussed:    Additional Comments:  Alice Bond A, RN 03/05/2016, 11:21 AM

## 2016-03-05 NOTE — Progress Notes (Signed)
03/05/2016 11:25 AM  BP 138/77 (BP Location: Left Arm)   Pulse 71   Temp 98.9 F (37.2 C) (Oral)   Resp 16   Ht 5\' 7"  (1.702 m)   Wt 76.7 kg (169 lb 3.2 oz)   SpO2 97%   BMI 26.50 kg/m  Patient discharged per MD orders. Discharge instructions reviewed with patient and patient verbalized understanding. IV removed per policy. Prescriptions discussed and given to patient. Discharged via wheelchair escorted by nursing staff.  Ron ParkerHerron, Cherri Yera D, RN

## 2016-03-05 NOTE — Discharge Instructions (Signed)
May shower No care for J-tube necessary except change dressing daily as needed Resume all home medications and prescribed medications. [facility]-10 days. Regular diet

## 2016-03-05 NOTE — Progress Notes (Signed)
10 Days Post-Op  Subjective: Status post repair of perforated ulcer. Patient has tolerated being off the tube feeds with less frequent bowel movements and no nausea vomiting she is tolerating a regular diet.  Objective: Vital signs in last 24 hours: Temp:  [98.1 F (36.7 C)-98.9 F (37.2 C)] 98.9 F (37.2 C) (11/26 0446) Pulse Rate:  [71-89] 71 (11/26 0446) Resp:  [16-17] 16 (11/26 0446) BP: (117-145)/(77-88) 138/77 (11/26 0446) SpO2:  [96 %-98 %] 97 % (11/26 0446) Last BM Date: 03/04/16  Intake/Output from previous day: 11/25 0701 - 11/26 0700 In: 1500 [P.O.:1500] Out: 480 [Urine:450; Drains:30] Intake/Output this shift: No intake/output data recorded.  Physical exam:  Serous fluid and drains. Soft nontender abdomen wound is clean no erythema or drainage. J-tube is capped.  Lab Results: CBC   Recent Labs  03/03/16 0533  WBC 10.8  HGB 12.3  HCT 37.0  PLT 241   BMET  Recent Labs  03/03/16 0533  NA 141  K 3.6  CL 108  CO2 26  GLUCOSE 123*  BUN 20  CREATININE 0.76  CALCIUM 8.5*   PT/INR No results for input(s): LABPROT, INR in the last 72 hours. ABG No results for input(s): PHART, HCO3 in the last 72 hours.  Invalid input(s): PCO2, PO2  Studies/Results: No results found.  Anti-infectives: Anti-infectives    Start     Dose/Rate Route Frequency Ordered Stop   02/25/16 0600  piperacillin-tazobactam (ZOSYN) IVPB 3.375 g  Status:  Discontinued     3.375 g 12.5 mL/hr over 240 Minutes Intravenous Every 8 hours 02/25/16 0023 03/03/16 0858   02/24/16 2036  piperacillin-tazobactam (ZOSYN) 3.375 GM/50ML IVPB  Status:  Discontinued    Comments:  NEAL, SUSAN: cabinet override      02/24/16 2036 02/24/16 2341      Assessment/Plan: s/p Procedure(s): EXPLORATORY LAPAROTOMY with repair of duodenal perforation   Both drains are removed without difficulty. J-tube is in place. Recommend discharge today to follow-up in the office in 5-10 days.  Lattie Hawichard E  Cooper, MD, FACS  03/05/2016

## 2016-03-06 ENCOUNTER — Other Ambulatory Visit: Payer: Self-pay

## 2016-03-07 ENCOUNTER — Telehealth: Payer: Self-pay | Admitting: Surgery

## 2016-03-07 NOTE — Telephone Encounter (Signed)
Call made to [facility] at this time. Spoke with Melissa and then a Gentleman in regards to patient's referral for home health care.   He states that the order sent over from Care Management only indicates Physical Therapy. I did clarify what has been placed in our computer system. However, he states that he needs an order faxed over for Home Health Nursing. This has been faxed over with positive confirmation at this time.  Staff from [facility] will call patient immediately to schedule home nursing visit.

## 2016-03-07 NOTE — Telephone Encounter (Signed)
Patient needs someone to come into her home and help her change her bandage and look at tube. [facility] needs a order for a home nurse instead of physical therapy. Patient would like to talk to someone today.  She needs a nurse by Thursday afternoon. There fax number is [phone]

## 2016-03-10 ENCOUNTER — Ambulatory Visit (INDEPENDENT_AMBULATORY_CARE_PROVIDER_SITE_OTHER): Payer: Medicare Other | Admitting: Surgery

## 2016-03-10 ENCOUNTER — Encounter: Payer: Self-pay | Admitting: Surgery

## 2016-03-10 VITALS — BP 146/74 | HR 81 | Temp 97.9°F | Ht 67.0 in | Wt 160.0 lb

## 2016-03-10 DIAGNOSIS — K319 Disease of stomach and duodenum, unspecified: Secondary | ICD-10-CM

## 2016-03-10 DIAGNOSIS — K30 Functional dyspepsia: Secondary | ICD-10-CM

## 2016-03-10 NOTE — Patient Instructions (Signed)
Please refer to your appointment date below  Please call our office with any questions or concerns.  Please do not submerge in a tub, hot tub, or pool until incisions are completely sealed.  Use sun block to incision area over the next year if this area will be exposed to sun. This helps decrease scarring.  You may now resume your normal activities. Listen to your body when lifting, if you have pain when lifting, stop and then try again in a few days.  If you develop redness, drainage, or pain at incision sites- call our office immediately and speak with a nurse.

## 2016-03-10 NOTE — Progress Notes (Signed)
Outpatient postop visit  03/10/2016  Alice Bond is an 82 y.o. female.    Procedure: Graham's patch  CC: Well  HPI: Patient feels well after Graham's patch. She had a prolonged hospitalization due to ileus and poor gastric emptying. But now she is feeling well tolerating regular diet she knows that when she eats too much she sometimes has pain but is having normal bowel movements no fevers or chills.  Medications reviewed.    Physical Exam:  BP (!) 146/74   Pulse 81   Temp 97.9 F (36.6 C) (Oral)   Ht 5\' 7"  (1.702 m)   Wt 160 lb (72.6 kg)   BMI 25.06 kg/m     PE: Wound is clean without erythema or ecchymosis. Staples removed Steri-Strips and benzoin are placed. J-tube is in place with no erythema and capped.  Calves are nontender  Assessment/Plan:  Discussed with the patient J-tube care and the plan to remove the J-tube in a few weeks. I would leave that up to Dr. Everlene FarrierPabon. She'll see Dr. Valaria GoodPabone in the next 1 or 2 weeks when he is available and he can discuss removal of the J-tube at a later date. Overall the patient is doing excellent and will follow up as above.  Lattie Hawichard E Tacie Mccuistion, MD, FACS

## 2016-03-24 ENCOUNTER — Encounter: Payer: Self-pay | Admitting: Surgery

## 2016-03-24 ENCOUNTER — Telehealth: Payer: Self-pay

## 2016-03-24 ENCOUNTER — Ambulatory Visit (INDEPENDENT_AMBULATORY_CARE_PROVIDER_SITE_OTHER): Payer: Medicare Other | Admitting: Surgery

## 2016-03-24 ENCOUNTER — Other Ambulatory Visit: Payer: Self-pay

## 2016-03-24 VITALS — BP 149/80 | HR 86 | Temp 98.1°F | Ht 67.0 in | Wt 166.0 lb

## 2016-03-24 DIAGNOSIS — Z09 Encounter for follow-up examination after completed treatment for conditions other than malignant neoplasm: Secondary | ICD-10-CM

## 2016-03-24 MED ORDER — AMOXICILLIN 500 MG PO TABS: 1000.0000 mg | ORAL_TABLET | Freq: Two times a day (BID) | ORAL | 0 refills | Status: DC

## 2016-03-24 MED ORDER — CLARITHROMYCIN 500 MG PO TABS: 500.0000 mg | ORAL_TABLET | Freq: Two times a day (BID) | ORAL | 0 refills | Status: DC

## 2016-03-24 NOTE — Patient Instructions (Signed)
We will send the referral for you to have the EGD and someone from their office will contact you to make an appointment. Please keep a a dry dressing over the drain site as long as you have drainage. Please call our office if you have any questions.

## 2016-03-24 NOTE — Telephone Encounter (Signed)
LVM for patient to call office regarding the below appointment. EGD -03/28/16 Appointment reminder mailed also.

## 2016-03-24 NOTE — Telephone Encounter (Signed)
Spoke with patient at this time. We discussed Upper Endoscopy instructions sheet for her procedure on 03/28/16 with Dr.Wohl.

## 2016-03-24 NOTE — Addendum Note (Signed)
Addended by: Arty BaumgartnerSTRUNK, Trevell Pariseau G on: 03/24/2016 10:40 AM   Modules accepted: Orders

## 2016-03-24 NOTE — Progress Notes (Signed)
S/p repair of posterior duodenal ulcer w jejunostomy tube Doing very well Tolerating Regular diet, No N/V On PPI  PE NAD Abd: soft , NT, j tube removed, no infection  A./P Treat empirically w H pylori EGD by GI in a couple of weeks to assess for potential malignancy No surgical issues RTC prn

## 2016-03-27 ENCOUNTER — Telehealth: Payer: Self-pay | Admitting: Gastroenterology

## 2016-03-27 NOTE — Telephone Encounter (Signed)
[facility]

## 2016-03-27 NOTE — Telephone Encounter (Signed)
Patient called and would like to talk to you regarding an upcoming procedure and her taking a medication

## 2016-03-27 NOTE — Telephone Encounter (Signed)
Pt needed to reschedule EGD from 03/28/16 to 04/11/16.

## 2016-03-28 ENCOUNTER — Other Ambulatory Visit: Payer: Self-pay

## 2016-03-28 NOTE — Telephone Encounter (Signed)
-----   Message from Nicole KindredAngela K Brouillard sent at 03/24/2016  9:53 AM EST ----- Regarding: FW: EGD   ----- Message ----- From: Cameron ProudWindella S Childers, CMA Sent: 03/24/2016   9:29 AM To: Bevelyn BucklesAngela K Brouillard, Windella S Childers, CMA Subject: EGD                                            Please send referral to GI for patient to have EGD. Hx of repair of  duodenal perforation

## 2016-03-28 NOTE — Telephone Encounter (Signed)
[facility]. Pt aware of change.

## 2016-04-05 ENCOUNTER — Encounter: Payer: Self-pay | Admitting: *Deleted

## 2016-04-11 ENCOUNTER — Ambulatory Visit: Admission: RE | Admit: 2016-04-11 | Payer: Medicare Other | Source: Ambulatory Visit | Admitting: Gastroenterology

## 2016-04-11 ENCOUNTER — Encounter: Admission: RE | Payer: Self-pay | Source: Ambulatory Visit

## 2016-04-11 SURGERY — ESOPHAGOGASTRODUODENOSCOPY (EGD) WITH PROPOFOL
Anesthesia: General

## 2016-04-12 NOTE — Discharge Instructions (Signed)

## 2016-04-20 ENCOUNTER — Ambulatory Visit
Admission: RE | Admit: 2016-04-20 | Discharge: 2016-04-20 | Disposition: A | Discharge status: Home / Self Care | Payer: Medicare Other | Source: Ambulatory Visit | Attending: Gastroenterology | Admitting: Gastroenterology

## 2016-04-20 ENCOUNTER — Encounter
Admission: RE | Disposition: A | Discharge status: Home / Self Care | Payer: Self-pay | Source: Ambulatory Visit | Attending: Gastroenterology

## 2016-04-20 ENCOUNTER — Ambulatory Visit: Payer: Medicare Other | Admitting: Anesthesiology

## 2016-04-20 DIAGNOSIS — Z79899 Other long term (current) drug therapy: Secondary | ICD-10-CM | POA: Insufficient documentation

## 2016-04-20 DIAGNOSIS — H401190 Primary open-angle glaucoma, unspecified eye, stage unspecified: Secondary | ICD-10-CM | POA: Insufficient documentation

## 2016-04-20 DIAGNOSIS — Z09 Encounter for follow-up examination after completed treatment for conditions other than malignant neoplasm: Secondary | ICD-10-CM | POA: Diagnosis not present

## 2016-04-20 DIAGNOSIS — Z8719 Personal history of other diseases of the digestive system: Secondary | ICD-10-CM | POA: Diagnosis not present

## 2016-04-20 DIAGNOSIS — K2950 Unspecified chronic gastritis without bleeding: Secondary | ICD-10-CM | POA: Insufficient documentation

## 2016-04-20 HISTORY — DX: Unspecified osteoarthritis, unspecified site: M19.90

## 2016-04-20 HISTORY — PX: ESOPHAGOGASTRODUODENOSCOPY (EGD) WITH PROPOFOL: SHX5813

## 2016-04-20 HISTORY — DX: Deviated nasal septum: J34.2

## 2016-04-20 HISTORY — DX: Primary open-angle glaucoma, unspecified eye, stage unspecified: H40.1190

## 2016-04-20 HISTORY — DX: Lichen planus, unspecified: L43.9

## 2016-04-20 SURGERY — ESOPHAGOGASTRODUODENOSCOPY (EGD) WITH PROPOFOL
Anesthesia: Monitor Anesthesia Care | Wound class: Clean Contaminated

## 2016-04-20 MED ORDER — PROPOFOL 10 MG/ML IV BOLUS
INTRAVENOUS | Status: DC | PRN
  Administered 2016-04-20: 10 mg via INTRAVENOUS
  Administered 2016-04-20: 40 mg via INTRAVENOUS
  Administered 2016-04-20: 70 mg via INTRAVENOUS
  Administered 2016-04-20: 30 mg via INTRAVENOUS

## 2016-04-20 MED ORDER — LACTATED RINGERS IV SOLN
INTRAVENOUS | Status: DC
  Administered 2016-04-20: 09:00:00 via INTRAVENOUS

## 2016-04-20 MED ORDER — LIDOCAINE HCL (CARDIAC) 20 MG/ML IV SOLN
INTRAVENOUS | Status: DC | PRN
  Administered 2016-04-20: 40 mg via INTRAVENOUS

## 2016-04-20 MED ORDER — GLYCOPYRROLATE 0.2 MG/ML IJ SOLN
INTRAMUSCULAR | Status: DC | PRN
  Administered 2016-04-20: 0.2 mg via INTRAVENOUS

## 2016-04-20 SURGICAL SUPPLY — 32 items

## 2016-04-20 NOTE — Anesthesia Postprocedure Evaluation (Signed)
Anesthesia Post Note  Patient: Alice Bond  Procedure(s) Performed: Procedure(s) (LRB): ESOPHAGOGASTRODUODENOSCOPY (EGD) WITH PROPOFOL (N/A)  Patient location during evaluation: PACU Anesthesia Type: MAC Level of consciousness: awake and alert and oriented Pain management: pain level controlled Vital Signs Assessment: post-procedure vital signs reviewed and stable Respiratory status: spontaneous breathing and nonlabored ventilation Cardiovascular status: stable Postop Assessment: no signs of nausea or vomiting and adequate PO intake Anesthetic complications: no    Estill Batten

## 2016-04-20 NOTE — Transfer of Care (Signed)
Immediate Anesthesia Transfer of Care Note  Patient: Alice Bond  Procedure(s) Performed: Procedure(s): ESOPHAGOGASTRODUODENOSCOPY (EGD) WITH PROPOFOL (N/A)  Patient Location: PACU  Anesthesia Type: MAC  Level of Consciousness: awake, alert  and patient cooperative  Airway and Oxygen Therapy: Patient Spontanous Breathing and Patient connected to supplemental oxygen  Post-op Assessment: Post-op Vital signs reviewed, Patient's Cardiovascular Status Stable, Respiratory Function Stable, Patent Airway and No signs of Nausea or vomiting  Post-op Vital Signs: Reviewed and stable  Complications: No apparent anesthesia complications

## 2016-04-20 NOTE — Anesthesia Procedure Notes (Signed)
Procedure Name: MAC Date/Time: 04/20/2016 9:11 AM Performed by: Janna Arch Pre-anesthesia Checklist: Patient identified, Emergency Drugs available, Suction available and Patient being monitored Patient Re-evaluated:Patient Re-evaluated prior to inductionOxygen Delivery Method: Nasal cannula

## 2016-04-20 NOTE — H&P (Signed)
Midge Miniumarren Tyerra Loretto, MD [facility] [address] [place], [place] [address] Phone: [phone] Fax : [phone]  Primary Care Physician:  Mickey FarberHIES, DAVID, MD Primary Gastroenterologist:  Dr. Servando SnareWohl  Pre-Procedure History & Physical: HPI:  Phillis HaggisJanice Holm Evitt is a 83 y.o. female is here for an endoscopy.   Past Medical History:  Diagnosis Date  . Arthritis    hands, knees  . Deviated nasal septum   . Glaucoma primary, open angle   . Lichen planus    gums    Past Surgical History:  Procedure Laterality Date  . DILATION AND CURETTAGE OF UTERUS    . LAPAROTOMY N/A 02/24/2016   Procedure: EXPLORATORY LAPAROTOMY with repair of duodenal perforation;  Surgeon: Leafy Roiego F Pabon, MD;  Location: [facility] ORS;  Service: General;  Laterality: N/A;  . NECK SURGERY    . TUMOR EXCISION     salivary gland    Prior to Admission medications   Medication Sig Start Date End Date Taking? Authorizing Provider  acetaminophen (TYLENOL) 500 MG tablet Take by mouth.   Yes Historical Provider, MD  Calcium Citrate-Vitamin D (CALCIUM CITRATE + D) 315-250 MG-UNIT TABS Take by mouth.   Yes Historical Provider, MD  ketoconazole (NIZORAL) 2 % cream Apply topically. 07/28/15  Yes Historical Provider, MD  latanoprost (XALATAN) 0.005 % ophthalmic solution Apply to eye. 07/17/14  Yes Historical Provider, MD  metroNIDAZOLE (METROCREAM) 0.75 % cream Apply topically. 07/28/15  Yes Historical Provider, MD  pantoprazole (PROTONIX) 40 MG tablet Take 1 tablet (40 mg total) by mouth daily. 03/05/16  Yes Lattie Hawichard E Cooper, MD  tolterodine (DETROL) 1 MG tablet Take 1 tablet by mouth 1 day or 1 dose. 01/27/16 01/26/17 Yes Historical Provider, MD  trolamine salicylate (ASPERCREME) 10 % cream Apply topically.   Yes Historical Provider, MD  Multiple Vitamin (MULTI-VITAMINS) TABS Take by mouth.    Historical Provider, MD  triamcinolone (KENALOG) 0.1 % paste Apply topically. 11/04/13   Historical Provider, MD    Allergies as of 03/28/2016  .  (No Known Allergies)    Family History  Problem Relation Age of Onset  . Breast cancer Mother 5870  . Cancer Mother   . Breast cancer Maternal Aunt [id number]    Social History   Social History  . Marital status: Married    Spouse name: N/A  . Number of children: N/A  . Years of education: N/A   Occupational History  . Not on file.   Social History Main Topics  . Smoking status: Never Smoker  . Smokeless tobacco: Never Used  . Alcohol use Yes     Comment: seldom  . Drug use: No  . Sexual activity: Not on file   Other Topics Concern  . Not on file   Social History Narrative  . No narrative on file    Review of Systems: See HPI, otherwise negative ROS  Physical Exam: BP (!) 153/88   Pulse 80   Temp 98 F (36.7 C) (Tympanic)   Resp 18   Ht 5\' 7"  (1.702 m)   Wt 165 lb (74.8 kg)   SpO2 98%   BMI 25.84 kg/m  General:   Alert,  pleasant and cooperative in NAD Head:  Normocephalic and atraumatic. Neck:  Supple; no masses or thyromegaly. Lungs:  Clear throughout to auscultation.    Heart:  Regular rate and rhythm. Abdomen:  Soft, nontender and nondistended. Normal bowel sounds, without guarding, and without rebound.   Neurologic:  Alert and  oriented x4;  grossly normal neurologically.  Impression/Plan: Damyiah Moxley is here for an endoscopy to be performed for DU follow up  Risks, benefits, limitations, and alternatives regarding  endoscopy have been reviewed with the patient.  Questions have been answered.  All parties agreeable.   Midge Minium, MD  04/20/2016, 9:06 AM

## 2016-04-20 NOTE — Anesthesia Preprocedure Evaluation (Signed)
Anesthesia Evaluation  Patient identified by MRN, date of birth, ID band Patient awake    Reviewed: Allergy & Precautions, NPO status , Patient's Chart, lab work & pertinent test results  Airway Mallampati: I  TM Distance: >3 FB Neck ROM: Full    Dental no notable dental hx.    Pulmonary neg pulmonary ROS,    Pulmonary exam normal        Cardiovascular negative cardio ROS Normal cardiovascular exam     Neuro/Psych  Neuromuscular disease negative psych ROS   GI/Hepatic PUD,   Endo/Other  negative endocrine ROS  Renal/GU CRFRenal disease     Musculoskeletal  (+) Arthritis , Osteoarthritis,    Abdominal   Peds  Hematology negative hematology ROS (+)   Anesthesia Other Findings   Reproductive/Obstetrics                             Anesthesia Physical Anesthesia Plan  ASA: II  Anesthesia Plan: MAC   Post-op Pain Management:    Induction: Intravenous  Airway Management Planned:   Additional Equipment:   Intra-op Plan:   Post-operative Plan:   Informed Consent: I have reviewed the patients History and Physical, chart, labs and discussed the procedure including the risks, benefits and alternatives for the proposed anesthesia with the patient or authorized representative who has indicated his/her understanding and acceptance.     Plan Discussed with: CRNA  Anesthesia Plan Comments:         Anesthesia Quick Evaluation

## 2016-04-20 NOTE — Op Note (Signed)
[facility] Patient Name: Alice Bond Procedure Date: 04/20/2016 9:01 AM MRN: [id number] Account #: [id number] Date of Birth: [birth year] Admit Type: Outpatient Age: 83 Room: [facility] OR ROOM 01 Gender: Female Note Status: Finalized Procedure:            Upper GI endoscopy Indications:          Follow-up of duodenal perforation Providers:            Midge Minium MD, MD Referring MD:         Neomia Dear. Harrington Challenger, MD (Referring MD) Medicines:            Propofol per Anesthesia Complications:        No immediate complications. Procedure:            Pre-Anesthesia Assessment:                       - Prior to the procedure, a History and Physical was                        performed, and patient medications and allergies were                        reviewed. The patient's tolerance of previous                        anesthesia was also reviewed. The risks and benefits of                        the procedure and the sedation options and risks were                        discussed with the patient. All questions were                        answered, and informed consent was obtained. Prior                        Anticoagulants: The patient has taken no previous                        anticoagulant or antiplatelet agents. ASA Grade                        Assessment: II - A patient with mild systemic disease.                        After reviewing the risks and benefits, the patient was                        deemed in satisfactory condition to undergo the                        procedure.                       After obtaining informed consent, the endoscope was                        passed under direct vision. Throughout the procedure,  the patient's blood pressure, pulse, and oxygen                        saturations were monitored continuously. The Olympus                        GIF-HQ190 Endoscope (S#. [id number]) was introduced            through the mouth, and advanced to the third part of                        duodenum. The upper GI endoscopy was accomplished                        without difficulty. The patient tolerated the procedure                        well. Findings:      The examined esophagus was normal.      Localized minimal inflammation characterized by erythema was found in       the gastric antrum. Biopsies were taken with a cold forceps for       histology.      The examined duodenum was normal. Impression:           - Normal esophagus.                       - Gastritis. Biopsied.                       - Normal examined duodenum. Recommendation:       - Await pathology results.                       - Discharge patient to home.                       - Resume previous diet.                       - Continue present medications. Procedure Code(s):    --- Professional ---                       [phone], Esophagogastroduodenoscopy, flexible, transoral;                        with biopsy, single or multiple Diagnosis Code(s):    --- Professional ---                       K63.1, Perforation of intestine (nontraumatic)                       K29.70, Gastritis, unspecified, without bleeding CPT copyright 2016 American Medical Association. All rights reserved. The codes documented in this report are preliminary and upon coder review may  be revised to meet current compliance requirements. Midge Miniumarren Chukwudi Ewen MD, MD 04/20/2016 9:19:39 AM This report has been signed electronically. Number of Addenda: 0 Note Initiated On: 04/20/2016 9:01 AM Total Procedure Duration: 0 hours 4 minutes 21 seconds       [facility]

## 2016-04-21 ENCOUNTER — Encounter: Payer: Self-pay | Admitting: Gastroenterology

## 2016-04-23 ENCOUNTER — Encounter: Payer: Self-pay | Admitting: Gastroenterology

## 2016-06-28 ENCOUNTER — Other Ambulatory Visit: Payer: Self-pay

## 2016-07-27 DIAGNOSIS — R600 Localized edema: Secondary | ICD-10-CM | POA: Insufficient documentation

## 2018-03-26 DIAGNOSIS — Q631 Lobulated, fused and horseshoe kidney: Secondary | ICD-10-CM | POA: Insufficient documentation

## 2018-08-23 DIAGNOSIS — R030 Elevated blood-pressure reading, without diagnosis of hypertension: Secondary | ICD-10-CM | POA: Insufficient documentation

## 2018-08-28 DIAGNOSIS — Z8781 Personal history of (healed) traumatic fracture: Secondary | ICD-10-CM | POA: Insufficient documentation

## 2018-08-29 IMAGING — CT CT ABD-PELV W/ CM
2 of 5 series · 15 of 46 positions shown, 17 images · IV contrast (APPLIED)
Comparison: Abdominal MRI 12/15/2015

CLINICAL DATA: Abdominal pain. Elevated white blood cell count.
Vomiting.

EXAM:
CT ABDOMEN AND PELVIS WITH CONTRAST
TECHNIQUE: Multidetector CT imaging of the abdomen and pelvis was performed
using the standard protocol following bolus administration of
intravenous contrast.
CONTRAST:  100mL [id number] IOPAMIDOL (9Q6WRV-1OO) IV

[Series 2: axial st · axial · 0.78mm/px · z∈[-1142,-717]mm · 12 of 97 slices shown, 14 images]
[im 6/97  soft-tissue]
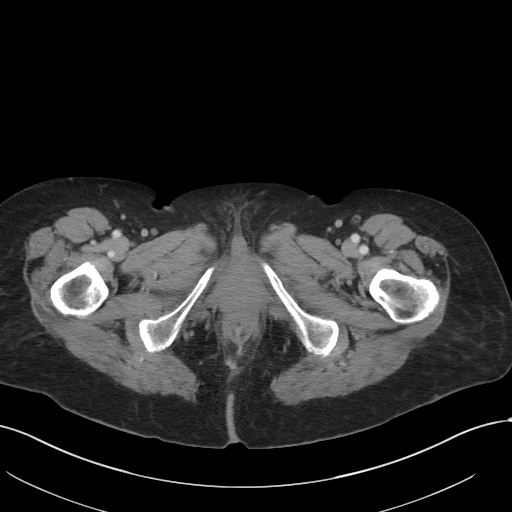
[im 6/97  bone]
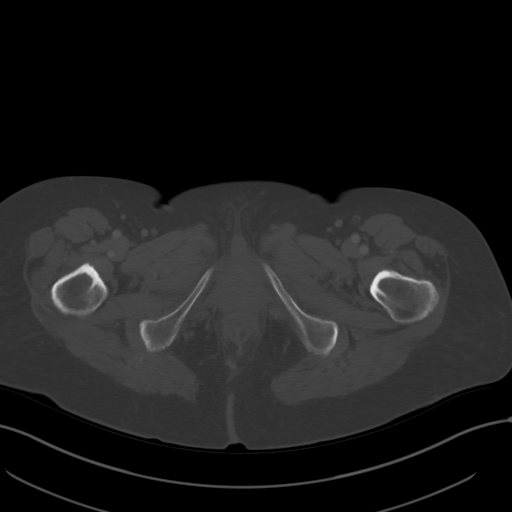
[im 17/97  soft-tissue]
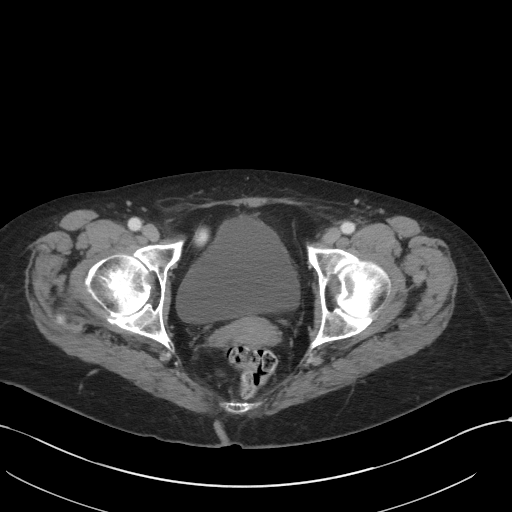
[im 22/97  soft-tissue]
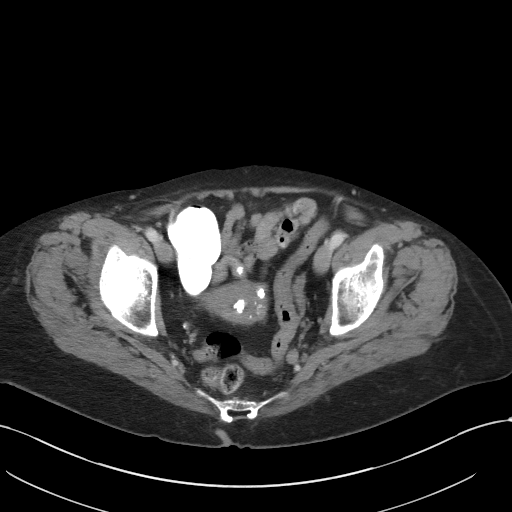
[im 27/97  soft-tissue]
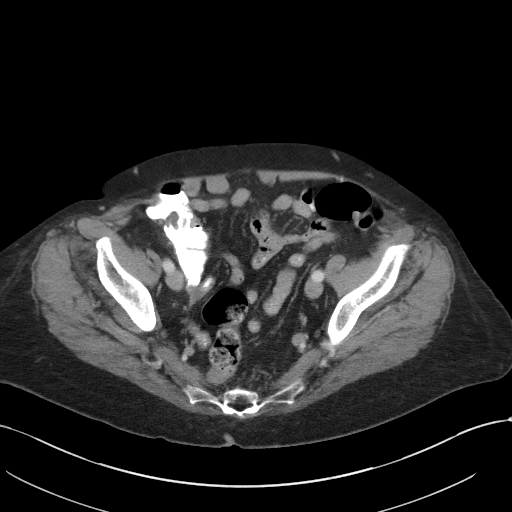
[im 38/97  soft-tissue]
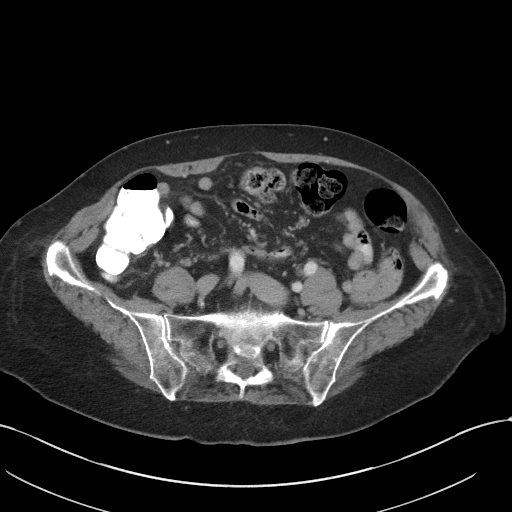
[im 43/97  soft-tissue]
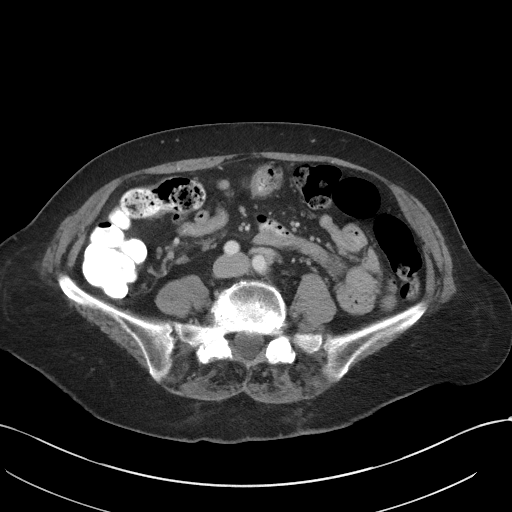
[im 54/97  soft-tissue]
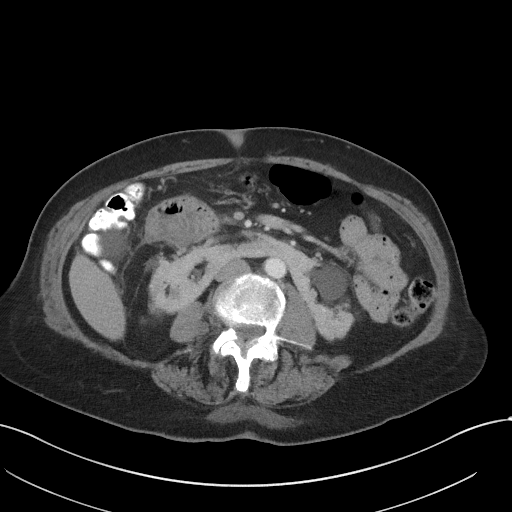
[im 59/97  soft-tissue]
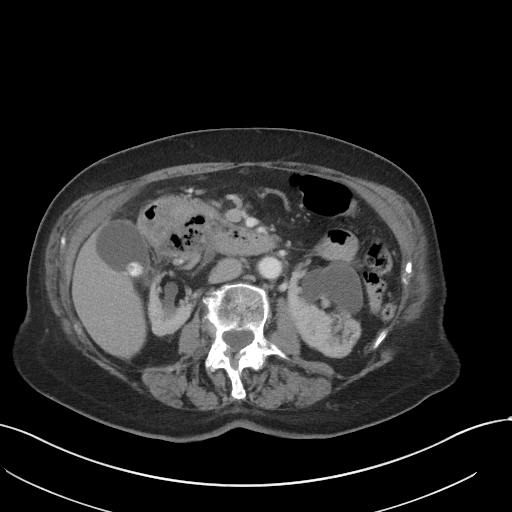
[im 70/97  soft-tissue]
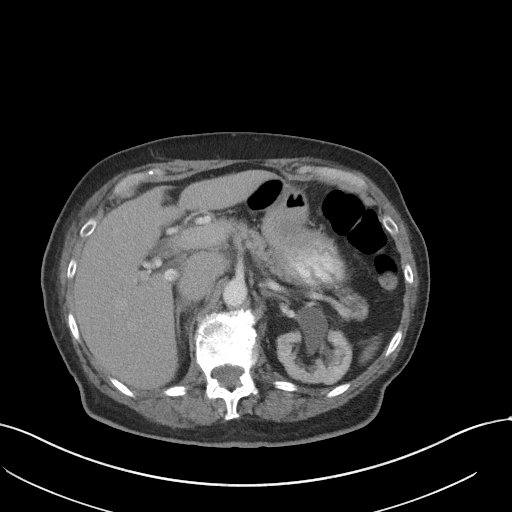
[im 70/97  bone]
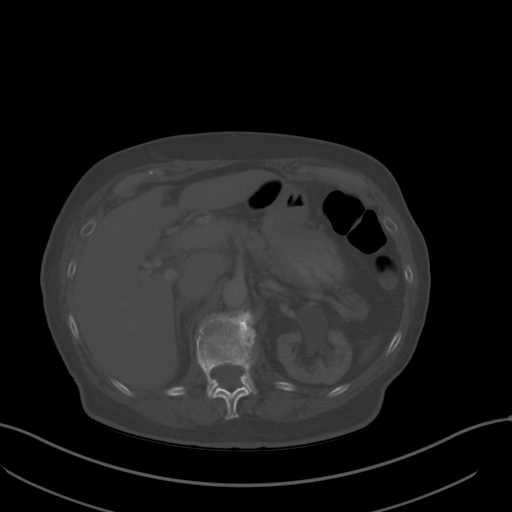
[im 75/97  soft-tissue]
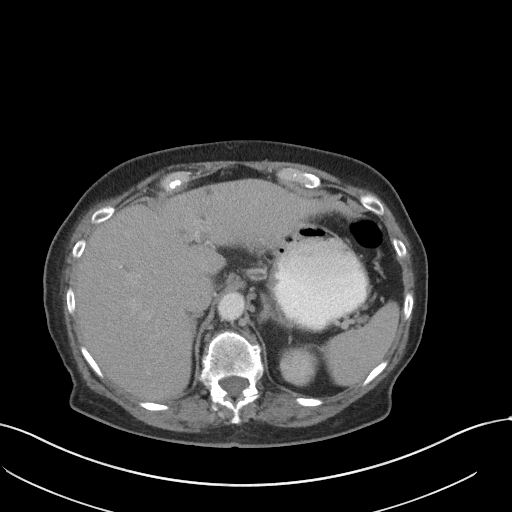
[im 81/97  soft-tissue]
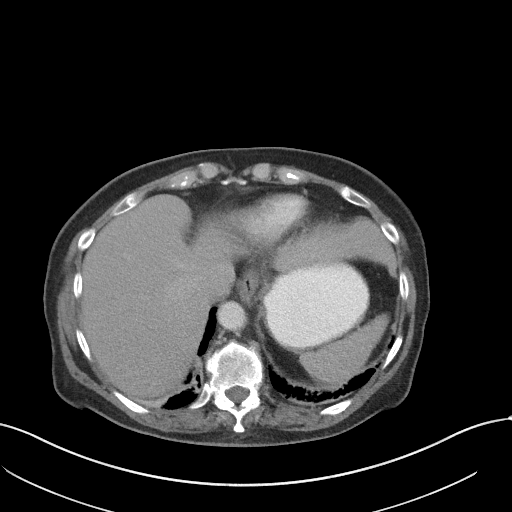
[im 91/97  soft-tissue]
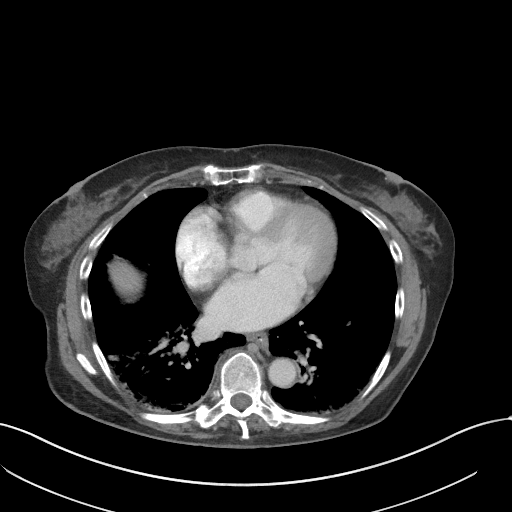

[Series 5: coronal st · coronal · 0.75mm/px · 3 of 83 slices shown]
[im 28/83  soft-tissue]
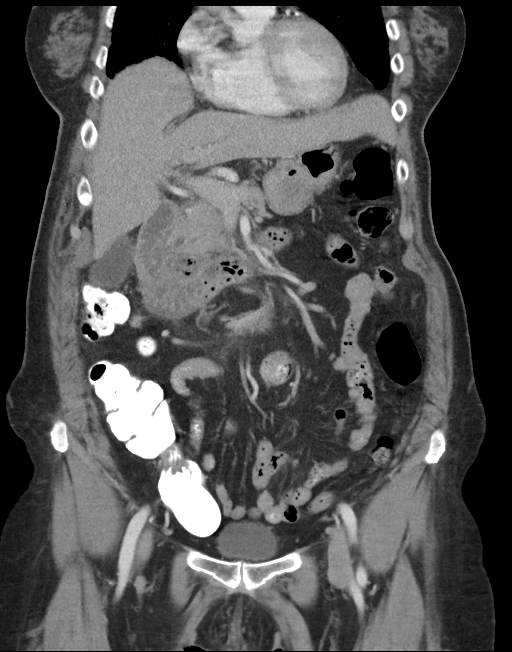
[im 37/83  soft-tissue]
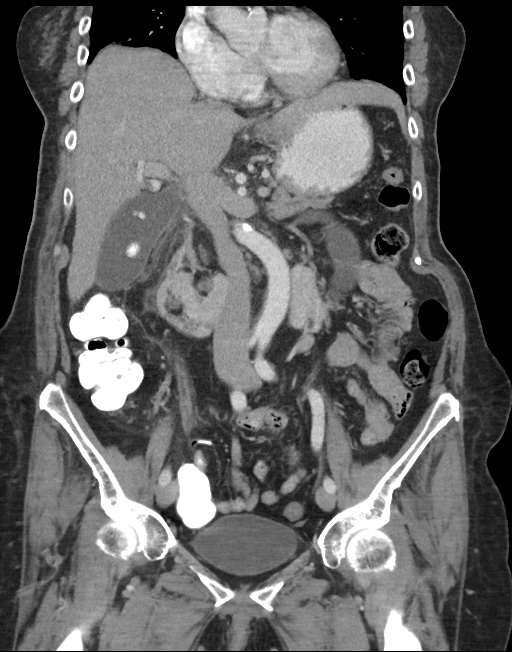
[im 46/83  soft-tissue]
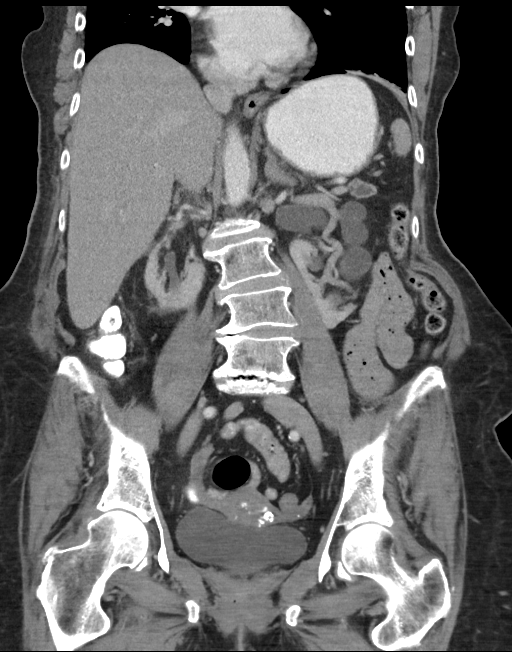

[15 of 46 positions shown; findings below may reference images not displayed]

FINDINGS: Lower chest: Streaky dependent lower lobe opacities, right greater
than left, likely hypoventilatory atelectasis. No pleural fluid.
Coronary artery calcifications are seen.

Hepatobiliary: Calcified gallstones within physiologically distended
gallbladder. Soft tissue stranding in the right upper quadrant felt
to be secondary to duodenal process, less likely gallbladder
inflammation, soft tissue stranding does not appear centered in the
gallbladder. Subcapsular 8 mm lesion in the left lobe of the liver
is likely a simple cyst. No biliary dilatation.

Pancreas: Cystic pancreatic tail lesion measures 3.5 x 1.1 cm with
probable internal septations. This is likely unchanged in size from
prior MRI allowing for differences in caliper placement and
modality. No main pancreatic ductal dilatation. No peripancreatic
inflammation.

Spleen: Normal in size without focal abnormality.

Adrenals/Urinary Tract: Horseshoe kidney with hydronephrosis of the
left renal moiety, chronic. The degree of hydronephrosis may have is
not significantly changed. No hydronephrosis of the right renal
moiety. No evidence urolithiasis. Urinary bladder is physiologically
distended. There is no adrenal nodule.

Stomach/Bowel: Stomach is physiologically distended with enteric
contrast. There is duodenal wall thickening involving the second and
third portion of the duodenum with irregular extraluminal air in
fluid adjacent to the duodenum lobe. Adjacent soft tissue stranding
is seen. Remaining small bowel is normal in caliber. There is
enteric contrast within the ascending and transverse colon. Moderate
stool burden. The appendix is normal.

Vascular/Lymphatic: Tortuous intra-abdominal vessels with mild
atherosclerosis. No aneurysm. Small retroperitoneal lymph nodes, not
enlarged by size criteria. No pelvic adenopathy.

Reproductive: Atrophic uterus with calcifications, may be
combination of fibroids and vascular. No adnexal mass.

Other: No ascites.

Musculoskeletal: Scoliosis and degenerative change in the spine.
Chronic appearing compression deformity of L1.
IMPRESSION: 1. Periduodenal inflammation with extraluminal fluid and air
concerning for duodenum perforation, possible duodenal ulcer.
2. Horseshoe kidney with chronic left hydronephrosis.
3. Cystic pancreatic tail mass, grossly stable from prior MRI,
follow-up recommendations as per prior MRI (in 2 years).
4. Cholelithiasis. Soft tissue stranding in the right upper quadrant
felt to be secondary to duodenal inflammation, and is not centered
on the gallbladder.
Critical Value/emergent results were called by telephone at the time
of interpretation on 02/24/2016 at [DATE] to Dr. Roeder , who
verbally acknowledged these results.

## 2018-12-18 DIAGNOSIS — M1712 Unilateral primary osteoarthritis, left knee: Secondary | ICD-10-CM | POA: Insufficient documentation

## 2019-02-03 DIAGNOSIS — S72002A Fracture of unspecified part of neck of left femur, initial encounter for closed fracture: Secondary | ICD-10-CM | POA: Insufficient documentation

## 2019-02-03 DIAGNOSIS — R54 Age-related physical debility: Secondary | ICD-10-CM | POA: Insufficient documentation

## 2019-02-03 DIAGNOSIS — R296 Repeated falls: Secondary | ICD-10-CM | POA: Insufficient documentation

## 2019-06-02 DIAGNOSIS — Z8719 Personal history of other diseases of the digestive system: Secondary | ICD-10-CM | POA: Insufficient documentation

## 2019-06-02 DIAGNOSIS — R2689 Other abnormalities of gait and mobility: Secondary | ICD-10-CM | POA: Insufficient documentation

## 2019-11-06 DIAGNOSIS — H04123 Dry eye syndrome of bilateral lacrimal glands: Secondary | ICD-10-CM | POA: Insufficient documentation

## 2019-11-06 DIAGNOSIS — H269 Unspecified cataract: Secondary | ICD-10-CM | POA: Insufficient documentation

## 2020-09-13 ENCOUNTER — Encounter (INDEPENDENT_AMBULATORY_CARE_PROVIDER_SITE_OTHER): Payer: Medicare Other

## 2020-09-13 ENCOUNTER — Encounter (INDEPENDENT_AMBULATORY_CARE_PROVIDER_SITE_OTHER): Payer: Self-pay | Admitting: Vascular Surgery

## 2020-09-16 ENCOUNTER — Other Ambulatory Visit (INDEPENDENT_AMBULATORY_CARE_PROVIDER_SITE_OTHER): Payer: Self-pay | Admitting: Nurse Practitioner

## 2020-09-16 DIAGNOSIS — I83813 Varicose veins of bilateral lower extremities with pain: Secondary | ICD-10-CM

## 2020-09-16 DIAGNOSIS — I872 Venous insufficiency (chronic) (peripheral): Secondary | ICD-10-CM

## 2020-09-20 ENCOUNTER — Ambulatory Visit (INDEPENDENT_AMBULATORY_CARE_PROVIDER_SITE_OTHER): Payer: Medicare PPO

## 2020-09-20 ENCOUNTER — Other Ambulatory Visit: Payer: Self-pay

## 2020-09-20 ENCOUNTER — Ambulatory Visit (INDEPENDENT_AMBULATORY_CARE_PROVIDER_SITE_OTHER): Payer: Medicare PPO | Admitting: Vascular Surgery

## 2020-09-20 VITALS — BP 170/82 | HR 67 | Ht 67.0 in | Wt 130.0 lb

## 2020-09-20 DIAGNOSIS — I83813 Varicose veins of bilateral lower extremities with pain: Secondary | ICD-10-CM

## 2020-09-20 DIAGNOSIS — I872 Venous insufficiency (chronic) (peripheral): Secondary | ICD-10-CM | POA: Diagnosis not present

## 2020-09-20 DIAGNOSIS — I890 Lymphedema, not elsewhere classified: Secondary | ICD-10-CM | POA: Diagnosis not present

## 2020-09-20 DIAGNOSIS — M5136 Other intervertebral disc degeneration, lumbar region: Secondary | ICD-10-CM | POA: Diagnosis not present

## 2020-09-21 ENCOUNTER — Encounter (INDEPENDENT_AMBULATORY_CARE_PROVIDER_SITE_OTHER): Payer: Self-pay | Admitting: Vascular Surgery

## 2020-09-21 DIAGNOSIS — I872 Venous insufficiency (chronic) (peripheral): Secondary | ICD-10-CM | POA: Insufficient documentation

## 2020-09-21 DIAGNOSIS — I890 Lymphedema, not elsewhere classified: Secondary | ICD-10-CM | POA: Insufficient documentation

## 2020-09-21 NOTE — Progress Notes (Signed)
MRN : [id number]  Alice Bond is a 87 y.o. (1929/05/14) female who presents with chief complaint of  Chief Complaint  Patient presents with   New Patient (Initial Visit)    New . Per referral- venous and consult. BIL edema  referred by [employer]  andrew  .  History of Present Illness:  Patient is seen for evaluation of right leg swelling. The patient first noticed the swelling remotely but is now concerned because of a significant increase in the overall edema. The swelling is associated with some pain and some discoloration. The patient notes that in the morning the legs are significantly improved but they steadily worsened throughout the course of the day. Elevation makes the legs better, dependency makes them much worse.   There is no history of ulcerations associated with the swelling.   The patient denies any recent changes in their medications.  The patient has been wearing graduated compression.  The patient has no had any past angiography, interventions or vascular surgery.  The patient denies a history of DVT or PE. There is no prior history of phlebitis. There is no history of primary lymphedema.  There is no history of radiation treatment to the groin or pelvis No history of malignancies. No history of trauma or groin or pelvic surgery. No history of foreign travel or parasitic infections area    Current Meds  Medication Sig   acetaminophen (TYLENOL) 500 MG tablet Take by mouth.   amoxicillin (AMOXIL) 500 MG capsule TAKE 2 TABLETS (1,000 MG TOTAL) BY MOUTH 2 (TWO) TIMES DAILY.   Calcium Citrate-Vitamin D 315-250 MG-UNIT TABS Take by mouth.   Calcium Citrate-Vitamin D 315-250 MG-UNIT TABS Take by mouth.   clarithromycin (BIAXIN) 500 MG tablet TAKE 1 TABLET (500 MG TOTAL) BY MOUTH 2 (TWO) TIMES DAILY.   ketoconazole (NIZORAL) 2 % cream Apply topically.   ketoconazole (NIZORAL) 2 % cream Apply topically.   latanoprost (XALATAN) 0.005 % ophthalmic solution Apply to  eye.   latanoprost (XALATAN) 0.005 % ophthalmic solution Apply to eye.   Lidocaine HCl 4 % CREA Apply topically.   metroNIDAZOLE (METROCREAM) 0.75 % cream Apply topically.   metroNIDAZOLE (METROCREAM) 0.75 % cream Apply topically.   mirabegron ER (MYRBETRIQ) 50 MG TB24 tablet Myrbetriq 50 mg tablet,extended release  TAKE 1 TABLET BY MOUTH EVERY DAY   Multiple Vitamin (MULTI-VITAMINS) TABS Take by mouth.   pantoprazole (PROTONIX) 40 MG tablet Take 1 tablet (40 mg total) by mouth daily.   triamcinolone (KENALOG) 0.1 % paste Apply topically.   triamcinolone (KENALOG) 0.1 % paste Apply topically.   trolamine salicylate (ASPERCREME) 10 % cream Apply topically.    Past Medical History:  Diagnosis Date   Arthritis    hands, knees   Deviated nasal septum    Glaucoma primary, open angle    Lichen planus    gums    Past Surgical History:  Procedure Laterality Date   DILATION AND CURETTAGE OF UTERUS     ESOPHAGOGASTRODUODENOSCOPY (EGD) WITH PROPOFOL N/A 04/20/2016   Procedure: ESOPHAGOGASTRODUODENOSCOPY (EGD) WITH PROPOFOL;  Surgeon: Midge Minium, MD;  Location: [facility];  Service: Endoscopy;  Laterality: N/A;   LAPAROTOMY N/A 02/24/2016   Procedure: EXPLORATORY LAPAROTOMY with repair of duodenal perforation;  Surgeon: Leafy Ro, MD;  Location: [facility];  Service: General;  Laterality: N/A;   NECK SURGERY     TUMOR EXCISION     salivary gland    Social History Social History   Tobacco  Use   Smoking status: Never   Smokeless tobacco: Never  Substance Use Topics   Alcohol use: Yes    Comment: seldom   Drug use: No    Family History Family History  Problem Relation Age of Onset   Breast cancer Mother 77   Cancer Mother    Breast cancer Maternal Aunt 28  No family history of bleeding/clotting disorders, porphyria or autoimmune disease   Allergies  Allergen Reactions   Adhesive [Tape]     Skin irritation     REVIEW OF SYSTEMS (Negative unless  checked)  Constitutional: [] Weight loss  [] Fever  [] Chills Cardiac: [] Chest pain   [] Chest pressure   [] Palpitations   [] Shortness of breath when laying flat   [] Shortness of breath with exertion. Vascular:  [] Pain in legs with walking   [] Pain in legs at rest  [] History of DVT   [] Phlebitis   [x] Swelling in legs   [x] Varicose veins   [] Non-healing ulcers Pulmonary:   [] Uses home oxygen   [] Productive cough   [] Hemoptysis   [] Wheeze  [] COPD   [] Asthma Neurologic:  [] Dizziness   [] Seizures   [] History of stroke   [] History of TIA  [] Aphasia   [] Vissual changes   [] Weakness or numbness in arm   [] Weakness or numbness in leg Musculoskeletal:   [] Joint swelling   [x] Joint pain   [] Low back pain Hematologic:  [] Easy bruising  [] Easy bleeding   [] Hypercoagulable state   [] Anemic Gastrointestinal:  [] Diarrhea   [] Vomiting  [] Gastroesophageal reflux/heartburn   [] Difficulty swallowing. Genitourinary:  [] Chronic kidney disease   [] Difficult urination  [] Frequent urination   [] Blood in urine Skin:  [] Rashes   [] Ulcers  Psychological:  [] History of anxiety   []  History of major depression.  Physical Examination  Vitals:   09/20/20 0829  BP: (!) 170/82  Pulse: 67  Weight: 130 lb (59 kg)  Height: 5\' 7"  (1.702 m)   Body mass index is 20.36 kg/m. Gen: WD/WN, NAD Head: [place]/AT, No temporalis wasting.  Ear/Nose/Throat: Hearing grossly intact, nares w/o erythema or drainage, poor dentition Eyes: PER, EOMI, sclera nonicteric.  Neck: Supple, no masses.  No bruit or JVD.  Pulmonary:  Good air movement, clear to auscultation bilaterally, no use of accessory muscles.  Cardiac: RRR, normal S1, S2, no Murmurs. Vascular:  scattered varicosities present bilaterally.  Mild venous stasis changes to the legs bilaterally.  2+ soft pitting edema right > left Vessel Right Left  Radial Palpable Palpable  PT Palpable Palpable  DP Palpable Palpable  Gastrointestinal: soft, non-distended. No guarding/no peritoneal  signs.  Musculoskeletal: M/S 5/5 throughout.  No deformity or atrophy.  Neurologic: CN 2-12 intact. Pain and light touch intact in extremities.  Symmetrical.  Speech is fluent. Motor exam as listed above. Psychiatric: Judgment intact, Mood & affect appropriate for pt's clinical situation. Dermatologic: Mild venous rashes no ulcers noted.  No changes consistent with cellulitis. Lymph : No  lichenification or skin changes of chronic lymphedema.  CBC Lab Results  Component Value Date   WBC 10.8 03/03/2016   HGB 12.3 03/03/2016   HCT 37.0 03/03/2016   MCV 88.8 03/03/2016   PLT 241 03/03/2016    BMET    Component Value Date/Time   NA 141 03/03/2016 0533   K 3.6 03/03/2016 0533   CL 108 03/03/2016 0533   CO2 26 03/03/2016 0533   GLUCOSE 123 (H) 03/03/2016 0533   BUN 20 03/03/2016 0533   CREATININE 0.76 03/03/2016 0533   CALCIUM 8.5 (L)  03/03/2016 0533   GFRNONAA >60 03/03/2016 0533   GFRAA >60 03/03/2016 0533   CrCl cannot be calculated (Patient's most recent lab result is older than the maximum 21 days allowed.).  COAG Lab Results  Component Value Date   INR 1.19 02/25/2016    Radiology VAS US LOWER EXTREMITY VENOUS REFLUX  Result Date: 09/20/2020  Lower Venous Reflux Study Patient Name:  Alice Bond  Date of Exam:   09/20/2020 Medical Rec #: [id number]          Accession #:    [id number] Date of Birth: 23-Dec-1929          Patient Gender: F Patient Age:   [age]Y Exam Location:  [facility] Procedure:      VAS US LOWER EXTREMITY VENOUS REFLUX Referring Phys: [id number] Erma PintoFALLON E BROWN --------------------------------------------------------------------------------  Indications: Pain, and Swelling.  Performing Technologist: Debbe BalesSolomon Mcclary RVS  Examination Guidelines: A complete evaluation includes B-mode imaging, spectral Doppler, color Doppler, and power Doppler as needed of all accessible portions of each vessel. Bilateral testing is considered an integral part of a  complete examination. Limited examinations for reoccurring indications may be performed as noted. The reflux portion of the exam is performed with the patient in reverse Trendelenburg. Significant venous reflux is defined as >500 ms in the superficial venous system, and >1 second in the deep venous system.  Venous Reflux Times +--------------+---------+------+-----------+------------+--------+ RIGHT         Reflux NoRefluxReflux TimeDiameter cmsComments                         Yes                                  +--------------+---------+------+-----------+------------+--------+ CFV           no                                             +--------------+---------+------+-----------+------------+--------+ FV prox       no                                             +--------------+---------+------+-----------+------------+--------+ FV mid        no                                             +--------------+---------+------+-----------+------------+--------+ FV dist       no                                             +--------------+---------+------+-----------+------------+--------+ Popliteal     no                                             +--------------+---------+------+-----------+------------+--------+ GSV at SFJ    no                            .  50              +--------------+---------+------+-----------+------------+--------+ GSV prox thighno                            .51              +--------------+---------+------+-----------+------------+--------+ GSV mid thigh no                            .36              +--------------+---------+------+-----------+------------+--------+ GSV dist thighno                            .56              +--------------+---------+------+-----------+------------+--------+ GSV at knee   no                            .45               +--------------+---------+------+-----------+------------+--------+ GSV prox calf no                            .28              +--------------+---------+------+-----------+------------+--------+  +--------------+---------+------+-----------+------------+--------+ LEFT          Reflux NoRefluxReflux TimeDiameter cmsComments                         Yes                                  +--------------+---------+------+-----------+------------+--------+ CFV           no                                             +--------------+---------+------+-----------+------------+--------+ FV prox       no                                             +--------------+---------+------+-----------+------------+--------+ FV mid        no                                             +--------------+---------+------+-----------+------------+--------+ FV dist       no                                             +--------------+---------+------+-----------+------------+--------+ Popliteal     no                                             +--------------+---------+------+-----------+------------+--------+ GSV at SFJ    no                            .  51              +--------------+---------+------+-----------+------------+--------+ GSV prox thighno                            .48              +--------------+---------+------+-----------+------------+--------+ GSV mid thigh no                            .44              +--------------+---------+------+-----------+------------+--------+ GSV dist thighno                            .45              +--------------+---------+------+-----------+------------+--------+ GSV at knee   no                            .49              +--------------+---------+------+-----------+------------+--------+ GSV prox calf no                            .39               +--------------+---------+------+-----------+------------+--------+   Summary: Bilateral: - No evidence of superficial venous thrombosis in the lower extremities, bilaterally.  - No evidence of deep venous insufficiency seen bilaterally in the lower extremity.  - No evidence of superficial venous reflux seen in the greater saphenous veins bilaterally.  Right: - No evidence of deep vein thrombosis seen in the right lower extremity, from the common femoral through the popliteal veins.  Left: - Color duplex evaluation of the left lower extremity shows there is thrombus in the popliteal vein.  - The Left Popliteal Vein and Tibial Peroneal Trunk appears to display Thrombus of an Acute/Chronic in [place].  *See table(s) above for measurements and observations. Electronically signed by Levora Dredge MD on 09/20/2020 at 4:33:06 PM.    Final       Assessment/Plan 1. Lymphedema No surgery or intervention at this point in time.  I have reviewed my discussion with the patient regarding venous insufficiency and why it causes symptoms. I have discussed with the patient the chronic skin changes that accompany venous insufficiency and the long term sequela such as ulceration. Patient will contnue wearing graduated compression stockings on a daily basis, as this has provided excellent control of his edema. The patient will put the stockings on first thing in the morning and removing them in the evening. The patient is reminded not to sleep in the stockings.  In addition, behavioral modification including elevation during the day will be initiated. Exercise is strongly encouraged.  Given the patient's good control and lack of any problems regarding the venous insufficiency and lymphedema a lymph pump in not need at this time.  The patient will follow up with me PRN should anything change.  The patient voices agreement with this plan.   2. Chronic venous insufficiency No surgery or intervention at this point in  time.  I have reviewed my discussion with the patient regarding venous insufficiency and why it causes symptoms. I have discussed with the patient the chronic skin changes that accompany venous insufficiency and the long term sequela such as ulceration. Patient will  contnue wearing graduated compression stockings on a daily basis, as this has provided excellent control of his edema. The patient will put the stockings on first thing in the morning and removing them in the evening. The patient is reminded not to sleep in the stockings.  In addition, behavioral modification including elevation during the day will be initiated. Exercise is strongly encouraged.  Given the patient's good control and lack of any problems regarding the venous insufficiency and lymphedema a lymph pump in not need at this time.  The patient will follow up with me PRN should anything change.  The patient voices agreement with this plan.   3. DDD (degenerative disc disease), lumbar Continue NSAID medications as already ordered, these medications have been reviewed and there are no changes at this time.  Continued activity and therapy was stressed.    Levora Dredge, MD  09/21/2020 9:54 AM

## 2023-01-09 DEATH — deceased
